# Patient Record
Sex: Female | Born: 1950 | Race: White | Hispanic: No | Marital: Married | Smoking: Never smoker
Health system: Southern US, Community
[De-identification: ages and names within clinical notes are randomized; demographics above are authoritative.]

## PROBLEM LIST (undated history)

## (undated) DIAGNOSIS — J90 Pleural effusion, not elsewhere classified: Secondary | ICD-10-CM

## (undated) DIAGNOSIS — J449 Chronic obstructive pulmonary disease, unspecified: Secondary | ICD-10-CM

## (undated) DIAGNOSIS — M069 Rheumatoid arthritis, unspecified: Secondary | ICD-10-CM

## (undated) DIAGNOSIS — I2510 Atherosclerotic heart disease of native coronary artery without angina pectoris: Secondary | ICD-10-CM

## (undated) DIAGNOSIS — D649 Anemia, unspecified: Secondary | ICD-10-CM

## (undated) DIAGNOSIS — I10 Essential (primary) hypertension: Secondary | ICD-10-CM

## (undated) DIAGNOSIS — J069 Acute upper respiratory infection, unspecified: Secondary | ICD-10-CM

## (undated) DIAGNOSIS — I82409 Acute embolism and thrombosis of unspecified deep veins of unspecified lower extremity: Secondary | ICD-10-CM

## (undated) DIAGNOSIS — C44101 Unspecified malignant neoplasm of skin of unspecified eyelid, including canthus: Secondary | ICD-10-CM

## (undated) DIAGNOSIS — T8859XA Other complications of anesthesia, initial encounter: Secondary | ICD-10-CM

## (undated) DIAGNOSIS — E785 Hyperlipidemia, unspecified: Secondary | ICD-10-CM

## (undated) DIAGNOSIS — I214 Non-ST elevation (NSTEMI) myocardial infarction: Secondary | ICD-10-CM

## (undated) DIAGNOSIS — R918 Other nonspecific abnormal finding of lung field: Secondary | ICD-10-CM

## (undated) DIAGNOSIS — T4145XA Adverse effect of unspecified anesthetic, initial encounter: Secondary | ICD-10-CM

## (undated) DIAGNOSIS — Z8739 Personal history of other diseases of the musculoskeletal system and connective tissue: Secondary | ICD-10-CM

## (undated) HISTORY — PX: CORONARY ANGIOPLASTY: SHX604

## (undated) HISTORY — DX: Atherosclerotic heart disease of native coronary artery without angina pectoris: I25.10

## (undated) HISTORY — PX: EYE SURGERY: SHX253

## (undated) HISTORY — PX: KNEE ARTHROSCOPY: SHX127

## (undated) HISTORY — DX: Chronic obstructive pulmonary disease, unspecified: J44.9

## (undated) HISTORY — DX: Rheumatoid arthritis, unspecified: M06.9

## (undated) HISTORY — DX: Other nonspecific abnormal finding of lung field: R91.8

## (undated) HISTORY — DX: Hyperlipidemia, unspecified: E78.5

## (undated) HISTORY — PX: APPENDECTOMY: SHX54

## (undated) HISTORY — PX: JOINT REPLACEMENT: SHX530

## (undated) HISTORY — DX: Pleural effusion, not elsewhere classified: J90

---

## 1987-01-26 DIAGNOSIS — D649 Anemia, unspecified: Secondary | ICD-10-CM

## 1987-01-26 HISTORY — DX: Anemia, unspecified: D64.9

## 1987-01-26 HISTORY — PX: ABDOMINAL HYSTERECTOMY: SHX81

## 1994-01-25 HISTORY — PX: STAPEDECTOMY: SHX2435

## 1997-07-04 ENCOUNTER — Observation Stay (HOSPITAL_COMMUNITY): Admission: EM | Admit: 1997-07-04 | Discharge: 1997-07-05 | Payer: Self-pay | Admitting: Emergency Medicine

## 1998-02-17 ENCOUNTER — Ambulatory Visit (HOSPITAL_COMMUNITY): Admission: RE | Admit: 1998-02-17 | Discharge: 1998-02-17 | Payer: Self-pay | Admitting: Obstetrics and Gynecology

## 1998-12-08 ENCOUNTER — Other Ambulatory Visit: Admission: RE | Admit: 1998-12-08 | Discharge: 1998-12-08 | Payer: Self-pay | Admitting: Obstetrics and Gynecology

## 1999-05-26 HISTORY — PX: COLONOSCOPY: SHX174

## 1999-06-25 ENCOUNTER — Ambulatory Visit (HOSPITAL_COMMUNITY): Admission: RE | Admit: 1999-06-25 | Discharge: 1999-06-25 | Payer: Self-pay | Admitting: *Deleted

## 1999-08-05 ENCOUNTER — Encounter: Payer: Self-pay | Admitting: Obstetrics and Gynecology

## 1999-08-05 ENCOUNTER — Encounter: Admission: RE | Admit: 1999-08-05 | Discharge: 1999-08-05 | Payer: Self-pay | Admitting: Obstetrics and Gynecology

## 2001-11-02 ENCOUNTER — Inpatient Hospital Stay (HOSPITAL_COMMUNITY): Admission: EM | Admit: 2001-11-02 | Discharge: 2001-11-10 | Payer: Self-pay | Admitting: Emergency Medicine

## 2001-11-02 ENCOUNTER — Encounter: Payer: Self-pay | Admitting: General Surgery

## 2001-11-06 ENCOUNTER — Encounter: Payer: Self-pay | Admitting: General Surgery

## 2002-02-06 ENCOUNTER — Encounter: Admission: RE | Admit: 2002-02-06 | Discharge: 2002-02-06 | Payer: Self-pay

## 2002-03-01 ENCOUNTER — Encounter: Payer: Self-pay | Admitting: Neurosurgery

## 2002-03-01 ENCOUNTER — Ambulatory Visit (HOSPITAL_COMMUNITY): Admission: AD | Admit: 2002-03-01 | Discharge: 2002-03-01 | Payer: Self-pay | Admitting: Neurosurgery

## 2002-03-19 ENCOUNTER — Encounter: Payer: Self-pay | Admitting: Neurosurgery

## 2002-03-19 ENCOUNTER — Encounter: Admission: RE | Admit: 2002-03-19 | Discharge: 2002-03-19 | Payer: Self-pay | Admitting: Neurosurgery

## 2002-04-05 ENCOUNTER — Encounter: Admission: RE | Admit: 2002-04-05 | Discharge: 2002-04-05 | Payer: Self-pay | Admitting: Neurosurgery

## 2002-04-05 ENCOUNTER — Encounter: Payer: Self-pay | Admitting: Neurosurgery

## 2002-05-16 ENCOUNTER — Encounter: Admission: RE | Admit: 2002-05-16 | Discharge: 2002-08-14 | Payer: Self-pay | Admitting: Neurosurgery

## 2002-07-20 ENCOUNTER — Encounter: Payer: Self-pay | Admitting: Obstetrics and Gynecology

## 2002-07-20 ENCOUNTER — Encounter: Admission: RE | Admit: 2002-07-20 | Discharge: 2002-07-20 | Payer: Self-pay | Admitting: Obstetrics and Gynecology

## 2002-10-11 ENCOUNTER — Encounter: Admission: RE | Admit: 2002-10-11 | Discharge: 2003-01-09 | Payer: Self-pay | Admitting: Neurosurgery

## 2003-05-09 ENCOUNTER — Encounter: Admission: RE | Admit: 2003-05-09 | Discharge: 2003-05-09 | Payer: Self-pay | Admitting: Family Medicine

## 2003-10-15 ENCOUNTER — Encounter: Admission: RE | Admit: 2003-10-15 | Discharge: 2003-10-15 | Payer: Self-pay | Admitting: Obstetrics and Gynecology

## 2004-01-26 DIAGNOSIS — C44101 Unspecified malignant neoplasm of skin of unspecified eyelid, including canthus: Secondary | ICD-10-CM

## 2004-01-26 HISTORY — DX: Unspecified malignant neoplasm of skin of unspecified eyelid, including canthus: C44.101

## 2005-06-30 ENCOUNTER — Encounter: Admission: RE | Admit: 2005-06-30 | Discharge: 2005-06-30 | Payer: Self-pay | Admitting: Obstetrics and Gynecology

## 2006-07-27 ENCOUNTER — Encounter: Admission: RE | Admit: 2006-07-27 | Discharge: 2006-07-27 | Payer: Self-pay | Admitting: Obstetrics and Gynecology

## 2008-05-16 ENCOUNTER — Encounter: Admission: RE | Admit: 2008-05-16 | Discharge: 2008-05-16 | Payer: Self-pay | Admitting: Obstetrics and Gynecology

## 2009-04-07 ENCOUNTER — Encounter: Admission: RE | Admit: 2009-04-07 | Discharge: 2009-04-07 | Payer: Self-pay | Admitting: Family Medicine

## 2009-09-25 DIAGNOSIS — J90 Pleural effusion, not elsewhere classified: Secondary | ICD-10-CM

## 2009-09-25 DIAGNOSIS — I214 Non-ST elevation (NSTEMI) myocardial infarction: Secondary | ICD-10-CM

## 2009-09-25 HISTORY — PX: CORONARY ARTERY BYPASS GRAFT: SHX141

## 2009-09-25 HISTORY — DX: Pleural effusion, not elsewhere classified: J90

## 2009-09-25 HISTORY — DX: Non-ST elevation (NSTEMI) myocardial infarction: I21.4

## 2009-09-25 HISTORY — PX: CARDIAC CATHETERIZATION: SHX172

## 2009-09-26 ENCOUNTER — Encounter: Payer: Self-pay | Admitting: Cardiology

## 2009-09-27 ENCOUNTER — Encounter: Payer: Self-pay | Admitting: Cardiology

## 2009-09-28 ENCOUNTER — Encounter: Payer: Self-pay | Admitting: Cardiology

## 2009-10-01 ENCOUNTER — Encounter: Payer: Self-pay | Admitting: Cardiology

## 2009-10-08 ENCOUNTER — Encounter: Payer: Self-pay | Admitting: Cardiology

## 2009-11-03 ENCOUNTER — Ambulatory Visit: Payer: Self-pay | Admitting: Cardiology

## 2009-11-03 DIAGNOSIS — I4891 Unspecified atrial fibrillation: Secondary | ICD-10-CM | POA: Insufficient documentation

## 2009-11-03 DIAGNOSIS — Z951 Presence of aortocoronary bypass graft: Secondary | ICD-10-CM

## 2009-11-03 DIAGNOSIS — E7800 Pure hypercholesterolemia, unspecified: Secondary | ICD-10-CM | POA: Insufficient documentation

## 2009-11-03 DIAGNOSIS — I2510 Atherosclerotic heart disease of native coronary artery without angina pectoris: Secondary | ICD-10-CM

## 2009-11-06 ENCOUNTER — Telehealth: Payer: Self-pay | Admitting: Cardiology

## 2009-11-07 ENCOUNTER — Telehealth: Payer: Self-pay | Admitting: Cardiology

## 2009-11-10 ENCOUNTER — Inpatient Hospital Stay (HOSPITAL_COMMUNITY): Admission: EM | Admit: 2009-11-10 | Discharge: 2009-11-12 | Payer: Self-pay | Admitting: Emergency Medicine

## 2009-11-10 ENCOUNTER — Ambulatory Visit: Payer: Self-pay | Admitting: Internal Medicine

## 2009-11-10 ENCOUNTER — Encounter: Payer: Self-pay | Admitting: Cardiology

## 2009-11-10 ENCOUNTER — Telehealth: Payer: Self-pay | Admitting: Cardiology

## 2009-11-11 ENCOUNTER — Encounter: Payer: Self-pay | Admitting: Cardiology

## 2009-11-20 ENCOUNTER — Encounter (HOSPITAL_COMMUNITY)
Admission: RE | Admit: 2009-11-20 | Discharge: 2010-02-18 | Payer: Self-pay | Source: Home / Self Care | Attending: Cardiology | Admitting: Cardiology

## 2009-11-21 ENCOUNTER — Telehealth: Payer: Self-pay | Admitting: Cardiology

## 2009-11-21 ENCOUNTER — Ambulatory Visit: Payer: Self-pay | Admitting: Pulmonary Disease

## 2009-11-21 ENCOUNTER — Encounter: Payer: Self-pay | Admitting: Pulmonary Disease

## 2009-11-21 DIAGNOSIS — R071 Chest pain on breathing: Secondary | ICD-10-CM

## 2009-11-21 DIAGNOSIS — J90 Pleural effusion, not elsewhere classified: Secondary | ICD-10-CM | POA: Insufficient documentation

## 2009-12-10 ENCOUNTER — Inpatient Hospital Stay (HOSPITAL_COMMUNITY)
Admission: EM | Admit: 2009-12-10 | Discharge: 2009-12-12 | Payer: Self-pay | Source: Home / Self Care | Admitting: Emergency Medicine

## 2009-12-10 ENCOUNTER — Ambulatory Visit: Payer: Self-pay | Admitting: Cardiovascular Disease

## 2009-12-10 ENCOUNTER — Ambulatory Visit: Payer: Self-pay | Admitting: Family Medicine

## 2009-12-11 ENCOUNTER — Encounter: Payer: Self-pay | Admitting: Pulmonary Disease

## 2009-12-12 ENCOUNTER — Encounter: Payer: Self-pay | Admitting: Family Medicine

## 2009-12-12 ENCOUNTER — Encounter: Payer: Self-pay | Admitting: Pulmonary Disease

## 2009-12-12 ENCOUNTER — Ambulatory Visit: Payer: Self-pay | Admitting: Cardiology

## 2009-12-16 ENCOUNTER — Telehealth: Payer: Self-pay | Admitting: Cardiology

## 2009-12-16 ENCOUNTER — Encounter: Payer: Self-pay | Admitting: Cardiology

## 2009-12-17 ENCOUNTER — Ambulatory Visit: Payer: Self-pay | Admitting: Pulmonary Disease

## 2009-12-17 ENCOUNTER — Encounter: Payer: Self-pay | Admitting: Cardiology

## 2009-12-17 DIAGNOSIS — M069 Rheumatoid arthritis, unspecified: Secondary | ICD-10-CM | POA: Insufficient documentation

## 2009-12-23 ENCOUNTER — Ambulatory Visit: Payer: Self-pay | Admitting: Cardiology

## 2009-12-23 ENCOUNTER — Telehealth: Payer: Self-pay | Admitting: Cardiology

## 2009-12-23 DIAGNOSIS — Z8669 Personal history of other diseases of the nervous system and sense organs: Secondary | ICD-10-CM

## 2009-12-23 LAB — CONVERTED CEMR LAB
BUN: 18 mg/dL (ref 6–23)
Chloride: 103 meq/L (ref 96–112)
Creatinine, Ser: 1.1 mg/dL (ref 0.4–1.2)
Glucose, Bld: 120 mg/dL — ABNORMAL HIGH (ref 70–99)

## 2009-12-24 ENCOUNTER — Encounter: Payer: Self-pay | Admitting: Cardiology

## 2010-01-05 ENCOUNTER — Encounter: Payer: Self-pay | Admitting: Cardiology

## 2010-01-07 ENCOUNTER — Encounter: Payer: Self-pay | Admitting: Cardiology

## 2010-02-15 ENCOUNTER — Encounter: Payer: Self-pay | Admitting: Family Medicine

## 2010-02-17 ENCOUNTER — Ambulatory Visit
Admission: RE | Admit: 2010-02-17 | Discharge: 2010-02-17 | Payer: Self-pay | Source: Home / Self Care | Attending: Cardiology | Admitting: Cardiology

## 2010-02-18 ENCOUNTER — Ambulatory Visit
Admission: RE | Admit: 2010-02-18 | Discharge: 2010-02-18 | Payer: Self-pay | Source: Home / Self Care | Attending: Cardiology | Admitting: Cardiology

## 2010-02-18 ENCOUNTER — Other Ambulatory Visit: Payer: Self-pay | Admitting: Cardiology

## 2010-02-18 LAB — LIPID PANEL
Cholesterol: 196 mg/dL (ref 0–200)
LDL Cholesterol: 126 mg/dL — ABNORMAL HIGH (ref 0–99)
Triglycerides: 101 mg/dL (ref 0.0–149.0)
VLDL: 20.2 mg/dL (ref 0.0–40.0)

## 2010-02-18 LAB — HEPATIC FUNCTION PANEL
ALT: 12 U/L (ref 0–35)
Albumin: 3.3 g/dL — ABNORMAL LOW (ref 3.5–5.2)
Total Bilirubin: 0.7 mg/dL (ref 0.3–1.2)

## 2010-02-19 ENCOUNTER — Encounter (HOSPITAL_COMMUNITY)
Admission: RE | Admit: 2010-02-19 | Discharge: 2010-02-24 | Payer: Self-pay | Source: Home / Self Care | Attending: Cardiology | Admitting: Cardiology

## 2010-02-24 NOTE — Miscellaneous (Signed)
Summary: RX for Furosemide and KCL  Clinical Lists Changes  Medications: Added new medication of FUROSEMIDE 20 MG TABS (FUROSEMIDE) one daily or as directed - Signed Added new medication of POTASSIUM CHLORIDE CRYS CR 10 MEQ CR-TABS (POTASSIUM CHLORIDE CRYS CR) one daily with Furosemide or as directed - Signed Rx of FUROSEMIDE 20 MG TABS (FUROSEMIDE) one daily or as directed;  #30 x 1;  Signed;  Entered by: Charolotte Capuchin, RN;  Authorized by: Rollene Rotunda, MD, [facility];  Method used: Faxed to [facility], [address], [place], [place], [place]  [address], Ph: [phone] or [phone], Fax: [phone] Rx of POTASSIUM CHLORIDE CRYS CR 10 MEQ CR-TABS (POTASSIUM CHLORIDE CRYS CR) one daily with Furosemide or as directed;  #30 x 1;  Signed;  Entered by: Charolotte Capuchin, RN;  Authorized by: Rollene Rotunda, MD, [facility];  Method used: Faxed to [facility], [address], [place], [place], [place]  [address], Ph: [phone] or [phone], Fax: [phone]    Prescriptions: POTASSIUM CHLORIDE CRYS CR 10 MEQ CR-TABS (POTASSIUM CHLORIDE CRYS CR) one daily with Furosemide or as directed  #30 x 1   Entered by:   Charolotte Capuchin, RN   Authorized by:   Rollene Rotunda, MD, [facility]   Signed by:   Charolotte Capuchin, RN on 11/04/2009   Method used:   Faxed to ...       [facility] (retail)       [address]       [place], [place]  [address]       Ph: [phone] or [phone]       Fax: [phone]   RxID:   [id number] FUROSEMIDE 20 MG TABS (FUROSEMIDE) one daily or as directed  #30 x 1   Entered by:   Charolotte Capuchin, RN   Authorized by:   Rollene Rotunda, MD, [facility]   Signed by:   Charolotte Capuchin, RN on 11/04/2009   Method used:   Faxed to ...       [facility] (retail)       [address]       [place], [place]  [address]       Ph: [phone] or  [phone]       Fax: [phone]   RxID:   [id number]

## 2010-02-24 NOTE — Assessment & Plan Note (Signed)
Summary: hfu/mhh   Visit Type:  Initial Consult Copy to:  [facility] Primary Provider/Referring Provider:  Dr. Nathanial Rancher  CC:  c/o left chest pain radiating to left side, lower back, and and left shoulder worse when laying down .  History of Present Illness: 59/F, never smoker with RA , for post hospital FU of pleural effusions post CABG  s/p emergent CABG x 3 in sep '11 @ [facility]  10/17-19/11 with rt chest pain & dyspnea, CT chest cardiomegaly and bilateral pleural effusions/bibasilar atelectasis.  Bilateral pulmonary nodules.  Mild right hilar adenopathy. Likely reactive related to congestive heart failure.    Chest fluoroscopy:  There is some limitation of range of motion of the left hemidiaphragm, there is no paradoxical motion to suggest paralysis.  2D echocardiogram :  LVEF 60- 65%.  Features consistent with grade 2 diastolic dysfunction.  Ventricular septum motion showed abnormal function and dissymmetry, which is consistent with a post thoracotomy state. PFTs >> Sever chronic obstructive pulmonary disease per PFTs. Moderately severe restrictive - parenchymal on PFTs (likely secondary to obesity and bilateral pleural effusions as well as impaired left diaphragm excursion). Given 20 mg pred x 5 ds, ON advair  & lasix , Just started rehab  November 21, 2009  Developed sharp lt chest pain x 1 day , radiating to left shoulder, pleuritic, non exertional Off methotrexate since CABG (Dr Dareen Piano)  EKG nml, mild T wave changes CXR - improved effusions, esp on rt, lt persists but better  Preventive Screening-Counseling & Management  Alcohol-Tobacco     Alcohol drinks/day: 0     Smoking Status: never  Current Medications (verified): 1)  Fish Oil 1000 Mg Caps (Omega-3 Fatty Acids) .... Up To 3 Tabs Daily 2)  Glucosamine-Chondroitin  Caps (Glucosamine-Chondroit-Vit C-Mn) .Marland Kitchen.. 1 By Mouth Daily 3)  Eleviv .Marland Kitchen.. 1 By Mouth Daily 4)  Aspirin 81 Mg  Tabs (Aspirin) .Marland Kitchen.. 1 By Mouth  Daily 5)  Folic Acid 800 Mcg Tabs (Folic Acid) .Marland Kitchen.. 1 By Mouth Daily 6)  Vitamin D3 2000 Unit Caps (Cholecalciferol) .Marland Kitchen.. 1 By Mouth Daily 7)  Probiotic  Caps (Probiotic Product) .Marland Kitchen.. 1 By Mouth Daily 8)  Metoprolol Tartrate 25 Mg Tabs (Metoprolol Tartrate) .... 1/2 By Mouth Two Times A Day 9)  Simvastatin 10 Mg Tabs (Simvastatin) .... Take 1/2 Tablet By Mouth Once A Day 10)  Oxycodone Hcl 5 Mg Tabs (Oxycodone Hcl) .... As Needed 11)  Optic-Vites  Tabs (Multiple Vitamins-Minerals) .... Opti-Vue--- Take 1 Tablet By Mouth Once A Day 12)  Stool Softener 100 Mg Caps (Docusate Sodium) .Marland Kitchen.. 1 By Mouth Daily 13)  Zyflamend .Marland Kitchen.. 1 By Mouth Daily 14)  Multitrace-4 [phone] Mcg/ml Soln (Trace Minerals Cr-Cu-Mn-Zn) .... One Drop Once Daily 15)  Calcium Magnesium 500-100-500 Liqd (Calcium-Magnesium-Vitamin D) .Marland Kitchen.. 1 By Mouth Daily 16)  Xango .... As Directed 17)  Furosemide 20 Mg Tabs (Furosemide) .... One Daily or As Directed 18)  Potassium Chloride Crys Cr 10 Meq Cr-Tabs (Potassium Chloride Crys Cr) .... One Daily With Furosemide or As Directed  Allergies (verified): 1)  ! * Aleve  Past History:  Past Medical History: Last updated: 11/03/2009 Hyperlipidemia (new) CAD  RA  Past Surgical History: Last updated: 11/03/2009 CABG (LIMA to the LAD, SVG right coronary artery, SVG to obtuse marginal September 2011 [employer]) C-Section x2 Arthroscopic knee surgery Stapedectomy C-Sections x2  Social History: Smoking Status:  never Alcohol drinks/day:  0  Review of Systems       The  patient complains of shortness of breath with activity, non-productive cough, chest pain, irregular heartbeats, loss of appetite, weight change, sore throat, headaches, nasal congestion/difficulty breathing through nose, sneezing, itching, and anxiety.  The patient denies shortness of breath at rest, productive cough, coughing up blood, acid heartburn, indigestion, abdominal pain, difficulty  swallowing, tooth/dental problems, ear ache, depression, hand/feet swelling, joint stiffness or pain, rash, change in color of mucus, and fever.    Vital Signs:  Patient profile:   60 year old female Height:      72 inches Weight:      207.2 pounds BMI:     28.20 O2 Sat:      94 % on Room air Temp:     98.6 degrees F oral Pulse rate:   78 / minute BP sitting:   144 / 84  (left arm) Cuff size:   regular  Vitals Entered By: Zackery Barefoot CMA (November 21, 2009 10:32 AM)  O2 Flow:  Room air CC: c/o left chest pain radiating to left side, lower back, and left shoulder worse when laying down  Pain Assessment Patient in pain? yes     Location: chest Intensity: 4 Type: sharp Onset of pain  Intermittent Comments Medications reviewed with patient Verified contact number and pharmacy with patient Zackery Barefoot CMA  November 21, 2009 10:33 AM    Physical Exam  Additional Exam:  Gen. Pleasant, well-nourished, in no distress, normal affect ENT - no lesions, no post nasal drip Neck: No JVD, no thyromegaly, no carotid bruits Lungs: no use of accessory muscles, no dullness to percussion, lt basal crackles, nor rhonchi  Cardiovascular: Rhythm regular, heart sounds  normal, no murmurs or gallops, no peripheral edema Abdomen: soft and non-tender, no hepatosplenomegaly, BS normal. Musculoskeletal: No deformities, no cyanosis or clubbing Neuro:  alert, non focal     Impression & Recommendations:  Problem # 1:  PLEURAL EFFUSION, LEFT (ICD-511.9) post CABg - will treat as pleurisy Low dose prednisone x 2 weeks then use NSAIDs , rpt CXR x 3 weeks ct lasix Orders: T-2 View CXR (71020TC) Est. Patient Level V (78469) Prescription Created Electronically [phone])  Problem # 2:  HYPERCHOLESTEROLEMIA (ICD-272.0) Off amio now, defer dosing of zocor to cards Her updated medication list for this problem includes:    Simvastatin 10 Mg Tabs (Simvastatin) .Marland Kitchen... Take 1/2 tablet by mouth once  a day  Problem # 3:  CHEST PAIN-PAINFUL RESPIRATION (WUX-324.40)  Not typical for angina CT angio neg for pE Favor pleuritic post CABG symptomatic treatment - Her updated medication list for this problem includes:    Aspirin 81 Mg Tabs (Aspirin) .Marland Kitchen... 1 by mouth daily  Orders: Est. Patient Level V (10272) Prescription Created Electronically [phone])  Medications Added to Medication List This Visit: 1)  Fish Oil 1000 Mg Caps (Omega-3 fatty acids) .... Up to 3 tabs daily 2)  Eleviv  .Marland KitchenMarland Kitchen. 1 by mouth daily 3)  Simvastatin 10 Mg Tabs (Simvastatin) .... Take 1/2 tablet by mouth once a day 4)  Oxycodone Hcl 5 Mg Tabs (Oxycodone hcl) .... As needed 5)  Optic-vites Tabs (Multiple vitamins-minerals) .... Opti-vue--- take 1 tablet by mouth once a day 6)  Multitrace-4 [phone] Mcg/ml Soln (Trace minerals cr-cu-mn-zn) .... One drop once daily 7)  Prednisone 5 Mg Tabs (Prednisone) .... Take 2 tabs once daily x 1 week then 1 tab once daily x 1 week  Patient Instructions: 1)  Copy sent to: dr hochrein 2)  Prednisone x 2 weeks as directed  3)  Then take ibuprofen two times a day with food 4)  Take tylenol or oxycodone as needed for pain 5)  Your chest xray looks better 6)  Please schedule a follow-up appointment in 3 weeks with chest xray Prescriptions: PREDNISONE 5 MG TABS (PREDNISONE) take 2 tabs once daily x 1 week then 1 tab once daily x 1 week  #21 x 0   Entered and Authorized by:   Comer Locket Vassie Loll MD   Signed by:   Comer Locket Vassie Loll MD on 11/21/2009   Method used:   Print then Give to Patient   RxID:   [id number]     Immunization History:  Influenza Immunization History:    Influenza:  historical (10/08/2009)  Pneumovax Immunization History:    Pneumovax:  historical (11/10/2009)

## 2010-02-24 NOTE — Progress Notes (Signed)
Summary: SOB/CHEST PAIN  Phone Note Call from Patient   Caller: Patient Reason for Call: Talk to Nurse Summary of Call: PT IS SHORT OF BREATH PT STATES CHEST PAIN WHEN SHE   Follow-up for Phone Call        Pt reports she is continuing to have shortness of breath and is having little urine output. Pt states she has also been having pain on right side and to right shoulder. Pain is worse when taking deep breath. Pt sounds very SOB while talking to me and is coughing frequently. I instructed pt she should call 911 and have ambulance transport her to ED at [facility].  She does not want to call EMS and will call friend to transport her.   Follow-up by: Dossie Arbour, RN, BSN,  November 10, 2009 12:29 PM

## 2010-02-24 NOTE — Letter (Signed)
Summary: [facility] Office Note   [facility] Office Note   Imported By: Roderic Ovens 12/10/2009 15:50:57  _____________________________________________________________________  External Attachment:    Type:   Image     Comment:   External Document

## 2010-02-24 NOTE — Assessment & Plan Note (Signed)
Summary: 3 week return/mhh   Copy to:  [facility] Primary Provider/Referring Provider:  Dr. Nathanial Rancher   History of Present Illness: 59/F, never smoker with RA , for post hospital FU of pleural effusions post CABG  s/p emergent CABG x 3 in sep '11 @ [facility]  10/17-19/11 with rt chest pain & dyspnea, CT chest cardiomegaly and bilateral pleural effusions/bibasilar atelectasis.  Bilateral pulmonary nodules.  Mild right hilar adenopathy. Likely reactive related to congestive heart failure.    Chest fluoroscopy:  Limited motion of the left hemidiaphragm, no paradoxical motion to suggest paralysis.  2D echocardiogram :  LVEF 60- 65%.  Features consistent with grade 2 diastolic dysfunction.  Ventricular septum motion showed abnormal function and dissymmetry, consistent with a post thoracotomy state. PFTs >> Severe airway obstructionper PFTs. Moderately severe restrictive - parenchymal on PFTs (likely secondary to obesity and bilateral pleural effusions as well as impaired left diaphragm excursion). Given 20 mg pred x 5 ds, ON advair  & lasix , Just started rehab  November 21, 2009  Developed sharp lt chest pain x 1 day , radiating to left shoulder, pleuritic, non exertional Off methotrexate since CABG (Dr Dareen Piano)  EKG nml, mild T wave changes, CXR - improved effusions, esp on rt, lt persists but better  December 17, 2009 11:24 AM  Hosp adm - dizzy & faint, hot & flushed, woke up on the lfoor - echo nml, CT angio neg PE , decreased pericardial effusion, rt pleural effusion - resolved, lt persists small with layering on decub film -IMPROVED from last month RA flared up the week before, Dr Dareen Piano kept her on 10 mg pred & restarted mTx -  Amio has been stopped. Tearful, needs Rx for prednisone & hydrocodone  Preventive Screening-Counseling & Management  Alcohol-Tobacco     Alcohol drinks/day: 0     Smoking Status: never  Current Medications (verified): 1)  Fish Oil 1000 Mg Caps  (Omega-3 Fatty Acids) .... Up To 3 Tabs Daily 2)  Glucosamine-Chondroitin  Caps (Glucosamine-Chondroit-Vit C-Mn) .Marland Kitchen.. 1 By Mouth Daily 3)  Eleviv .Marland Kitchen.. 1 By Mouth Daily 4)  Aspirin 81 Mg  Tabs (Aspirin) .Marland Kitchen.. 1 By Mouth Daily 5)  Folic Acid 800 Mcg Tabs (Folic Acid) .Marland Kitchen.. 1 By Mouth Daily 6)  Vitamin D3 2000 Unit Caps (Cholecalciferol) .Marland Kitchen.. 1 By Mouth Daily 7)  Probiotic  Caps (Probiotic Product) .Marland Kitchen.. 1 By Mouth Daily 8)  Metoprolol Tartrate 25 Mg Tabs (Metoprolol Tartrate) .... 1/2 By Mouth Two Times A Day 9)  Simvastatin 20 Mg Tabs (Simvastatin) .... Take 1 Tablet By Mouth Once A Day 10)  Oxycodone Hcl 5 Mg Tabs (Oxycodone Hcl) .... As Needed 11)  Optic-Vites  Tabs (Multiple Vitamins-Minerals) .... Opti-Vue--- Take 1 Tablet By Mouth Once A Day 12)  Stool Softener 100 Mg Caps (Docusate Sodium) .Marland Kitchen.. 1 By Mouth Daily 13)  Multitrace-4 [phone] Mcg/ml Soln (Trace Minerals Cr-Cu-Mn-Zn) .... 30 Drops Daily 14)  Calcium Magnesium 500-100-500 Liqd (Calcium-Magnesium-Vitamin D) .Marland Kitchen.. 1 By Mouth Daily 15)  Xango .... As Directed 16)  Furosemide 20 Mg Tabs (Furosemide) .... One Daily or As Directed 17)  Potassium Chloride Crys Cr 10 Meq Cr-Tabs (Potassium Chloride Crys Cr) .... One Daily With Furosemide or As Directed 18)  Advair Diskus 250-50 Mcg/dose Aepb (Fluticasone-Salmeterol) .... Inhale 1 Puff Two Times A Day 19)  Prednisone 1 Mg Tabs (Prednisone) .... 10mg  Once Daily 20)  Nitrostat 0.4 Mg Subl (Nitroglycerin) .... As Needed  Allergies (verified): 1)  ! * Aleve  Past History:  Past Medical History: Last updated: 11/03/2009 Hyperlipidemia (new) CAD  RA  Social History: Last updated: 11/03/2009 The patient has never smoked cigarettes.  She does not drink alcohol.  She is married with 2 children.  Review of Systems  The patient denies anorexia, fever, weight loss, weight gain, vision loss, decreased hearing, hoarseness, chest pain, syncope, dyspnea on exertion, peripheral edema,  prolonged cough, headaches, hemoptysis, abdominal pain, melena, hematochezia, severe indigestion/heartburn, hematuria, muscle weakness, suspicious skin lesions, difficulty walking, depression, unusual weight change, abnormal bleeding, enlarged lymph nodes, and angioedema.    Vital Signs:  Patient profile:   60 year old female Height:      72 inches Weight:      207 pounds BMI:     28.18 O2 Sat:      97 % on Room air Temp:     98.3 degrees F oral Pulse rate:   65 / minute BP sitting:   130 / 72  (right arm) Cuff size:   large  Vitals Entered By: Zackery Barefoot CMA (December 17, 2009 11:01 AM)  O2 Flow:  Room air Comments Medications reviewed with patient Verified contact number and pharmacy with patient Zackery Barefoot CMA  December 17, 2009 11:02 AM    Physical Exam  Additional Exam:  Gen. Pleasant, well-nourished, in no distress, normal affect ENT - no lesions, no post nasal drip Neck: No JVD, no thyromegaly, no carotid bruits Lungs: no use of accessory muscles, no dullness to percussion, lt basal crackles, no rhonchi  Cardiovascular: Rhythm regular, heart sounds  normal, no murmurs or gallops, no peripheral edema Musculoskeletal: No deformities, no cyanosis or clubbing      Impression & Recommendations:  Problem # 1:  PLEURAL EFFUSION, LEFT (ICD-511.9) Assessment Improved  post CABg - does not need thoracentesis - gradual but definite improvement ct lasix, FU CXR in 3 mnths steroids being used for RA flare - might help too. Unclear if nodules represent rheumatoid lung - will need FU CT in 6 mnths  Orders: Est. Patient Level IV (04540)  Problem # 2:  CHEST PAIN-PAINFUL RESPIRATION (ICD-786.52) resolved Her updated medication list for this problem includes:    Aspirin 81 Mg Tabs (Aspirin) .Marland Kitchen... 1 by mouth daily    Nitrostat 0.4 Mg Subl (Nitroglycerin) .Marland Kitchen... As needed  Problem # 3:  RHEUMATOID ARTHRITIS (ICD-714.0)  refilled hydrocodone until she sees dr  Dareen Piano  Orders: Est. Patient Level IV (98119)  Medications Added to Medication List This Visit: 1)  Simvastatin 20 Mg Tabs (Simvastatin) .... Take 1 tablet by mouth once a day 2)  Oxycodone Hcl 5 Mg Tabs (Oxycodone hcl) .... Two times a day as needed 3)  Multitrace-4 [phone] Mcg/ml Soln (Trace minerals cr-cu-mn-zn) .... 30 drops daily 4)  Advair Diskus 250-50 Mcg/dose Aepb (Fluticasone-salmeterol) .... Inhale 1 puff two times a day 5)  Advair Diskus 250-50 Mcg/dose Aepb (Fluticasone-salmeterol) .... Inhale 1 puff two times a day 6)  Prednisone 10 Mg Tabs (Prednisone) .... Once daily with food 7)  Prednisone 1 Mg Tabs (Prednisone) .... 10mg  once daily 8)  Nitrostat 0.4 Mg Subl (Nitroglycerin) .... As needed  Patient Instructions: 1)  Copy sent to: Dr Dareen Piano, dr hochrein 2)  Please schedule a follow-up appointment in 3 months with chest x ray 3)  Prednisone R x sent  in 4)  Your effusions (fluid ) are better 5)  Stay on the advair two times a day  Prescriptions: OXYCODONE HCL 5 MG TABS (OXYCODONE HCL) two  times a day as needed  #20 x 0   Entered and Authorized by:   Comer Locket. Vassie Loll MD   Signed by:   Comer Locket Vassie Loll MD on 12/17/2009   Method used:   Print then Give to Patient   RxID:   [id number] ADVAIR DISKUS 250-50 MCG/DOSE AEPB (FLUTICASONE-SALMETEROL) Inhale 1 puff two times a day  #1 x 3   Entered and Authorized by:   Comer Locket Vassie Loll MD   Signed by:   Comer Locket Vassie Loll MD on 12/17/2009   Method used:   Print then Give to Patient   RxID:   [id number] PREDNISONE 10 MG TABS (PREDNISONE) once daily with food  #30 x 0   Entered and Authorized by:   Comer Locket. Vassie Loll MD   Signed by:   Comer Locket Vassie Loll MD on 12/17/2009   Method used:   Print then Give to Patient   RxID:   [id number]

## 2010-02-24 NOTE — Letter (Signed)
Summary: [facility]  [facility]   Imported By: Marylou Mccoy 12/26/2009 17:21:17  _____________________________________________________________________  External Attachment:    Type:   Image     Comment:   External Document

## 2010-02-24 NOTE — Assessment & Plan Note (Signed)
Summary: np6/sob./ non-st elevation mi and cabg x 3  / gd   Visit Type:  Initial Consult Primary Provider:  Dr. Nathanial Rancher  CC:  CAD.  History of Present Illness: The patient presents as a new patient to establish of her coronary disease status post bypass.  She was on vacation in early September in [place]. She developed some epigastric discomfort followed by left arm pain. She presented to the emergency room where she was found to have a non-Q-wave myocardial infarction. She was subsequently found to have 50% left main stenosis, 90% LAD stenosis, 70% circumflex stenosis and an occluded right coronary artery with collaterals. Her EF was 55%. She underwent CABG with LIMA to the LAD and saphenous vein graft to the right coronary artery and obtuse marginal. She is now back in this area and is establishing with a cardiologist. She has had some problems with mild dyspnea and a slight cough but no PND or orthopnea. She's had no fevers or chills. She's had some sternal wound discomfort and numbness over her anterior chest. She's had mild discomfort in the left leg at the saphenous vein graft harvest site. She's been somewhat anxious. She has been limiting her activities. Of note she had postoperative atrial fibrillation but since going home she has not felt any tachycardia palpitations. She says when she lies back at night she can feel her heart beating she thinks strongly but not irregularly. She has not other symptoms that prompted her hospitalization.  Current Medications (verified): 1)  Fish Oil   Oil (Fish Oil) .Marland Kitchen.. 1 By Mouth Daily 2)  Glucosamine-Chondroitin  Caps (Glucosamine-Chondroit-Vit C-Mn) .Marland Kitchen.. 1 By Mouth Daily 3)  Elviv .Marland Kitchen.. 1 By Mouth Daily 4)  Aspirin 81 Mg  Tabs (Aspirin) .Marland Kitchen.. 1 By Mouth Daily 5)  Folic Acid 800 Mcg Tabs (Folic Acid) .Marland Kitchen.. 1 By Mouth Daily 6)  Vitamin D3 2000 Unit Caps (Cholecalciferol) .Marland Kitchen.. 1 By Mouth Daily 7)  Probiotic  Caps (Probiotic Product) .Marland Kitchen.. 1 By Mouth  Daily 8)  Amiodarone Hcl 200 Mg Tabs (Amiodarone Hcl) .Marland Kitchen.. 1 By Mouth Two Times A Day 9)  Metoprolol Tartrate 25 Mg Tabs (Metoprolol Tartrate) .... 1/2 By Mouth Two Times A Day 10)  Simvastatin 20 Mg Tabs (Simvastatin) .Marland Kitchen.. 1 By Mouth Daily 11)  Roxicodone 5 Mg Tabs (Oxycodone Hcl) .... As Needed 12)  Opti-Vue .Marland Kitchen.. 1 By Mouth Daily 13)  Stool Softener 100 Mg Caps (Docusate Sodium) .Marland Kitchen.. 1 By Mouth Daily 14)  Zyflamend .Marland Kitchen.. 1 By Mouth Daily 15)  Trace Mineral .... 1 By Mouth Daily 16)  Calcium Magnesium 500-100-500 Liqd (Calcium-Magnesium-Vitamin D) .Marland Kitchen.. 1 By Mouth Daily 17)  Xango .... As Directed  Allergies (verified): 1)  ! * Aleve  Past History:  Past Medical History: Hyperlipidemia (new) CAD  RA  Past Surgical History: CABG (LIMA to the LAD, SVG right coronary artery, SVG to obtuse marginal September 2011 [facility]) C-Section x2 Arthroscopic knee surgery Stapedectomy C-Sections x2  Family History: Her mother had bypass in her 41s. She has an older brother with atrial fibrillation.  Social History: The patient has never smoked cigarettes.  She does not drink alcohol.  She is married with 2 children.  Review of Systems       Positive for headaches, difficulty hearing, reflux. Otherwise as stated in the history of present illness negative for all other systems.  Vital Signs:  Patient profile:   60 year old female Height:      72 inches  Weight:      205 pounds BMI:     27.90 Pulse rate:   63 / minute Resp:     16 per minute BP sitting:   158 / 84  (right arm)  Vitals Entered By: Marrion Coy, CNA (November 03, 2009 2:22 PM)  Physical Exam  General:  Well developed, well nourished, in no acute distress. Head:  normocephalic and atraumatic Eyes:  PERRLA/EOM intact; conjunctiva and lids normal. Mouth:  Teeth, gums and palate normal. Oral mucosa normal. Neck:  Neck supple, no JVD. No masses, thyromegaly or abnormal cervical nodes. Chest Wall:   Well-healed sternotomy scar Lungs:  Mild decreased breath sounds in left base without crackles or wheezing Abdomen:  Bowel sounds positive; abdomen soft and non-tender without masses, organomegaly, or hernias noted. No hepatosplenomegaly. Msk:  Back normal, normal gait. Muscle strength and tone normal. Extremities:  Mild left leg edema with well-healed saphenous vein graft harvest sites Neurologic:  Alert and oriented x 3. Skin:  Intact without lesions or rashes. Cervical Nodes:  no significant adenopathy Axillary Nodes:  no significant adenopathy Inguinal Nodes:  no significant adenopathy Psych:  Slightly emotional but appropriate   Detailed Cardiovascular Exam  Neck    Carotids: Carotids full and equal bilaterally without bruits.      Neck Veins: Normal, no JVD.    Heart    Inspection: no deformities or lifts noted.      Palpation: normal PMI with no thrills palpable.      Auscultation: regular rate and rhythm, S1, S2 without murmurs, rubs, gallops, or clicks.    Vascular    Abdominal Aorta: no palpable masses, pulsations, or audible bruits.      Femoral Pulses: normal femoral pulses bilaterally.      Pedal Pulses: normal pedal pulses bilaterally.      Radial Pulses: normal radial pulses bilaterally.      Peripheral Circulation: no clubbing, cyanosis, or edema noted with normal capillary refill.     EKG  Procedure date:  11/03/2009  Findings:      Sinus rhythm, rate 63,axis within normal limits, intervals within normal limits, low voltage chest leads, no acute ST-T wave changes  Impression & Recommendations:  Problem # 1:  CORONARY ARTERY BYPASS GRAFT, HX OF (ICD-V45.81) The patient had many questions and I spent extra time to review this in detail with the patient and her husband.  She is somewhat SOB.  I will check a chest XRay. I will refer her to cardiac rehab.   I reviewed extensively all of her notes from the outside hospital. (Greater than one half hour with this  appointment.)    Problem # 2:  FIBRILLATION, ATRIAL (ICD-427.31) I will reduce amiodarone to 200 mg daily. I will likely stop this when I see her next.  Problem # 3:  HYPERCHOLESTEROLEMIA (ICD-272.0) I will reduce her simvastatin 10 mg while she is on the amiodarone. Orders: EKG w/ Interpretation (93000) Cardiac Rehabilitation (Cardiac Rehab)  Other Orders: T-2 View CXR (71020TC)  Patient Instructions: 1)  Your physician recommends that you schedule a follow-up appointment in: 1 month with Dr Antoine Poche 2)  Your physician has recommended you make the following change in your medication: Decrease Simvastatin to 10 mg a day and Amiodarone to 200 mg once a day 3)  A chest x-ray takes a picture of the organs and structures inside the chest, including the heart, lungs, and blood vessels. This test can show several things, including, whether the heart is enlarged;  whether fluid is building up in the lungs; and whether pacemaker / defibrillator leads are still in place. 4)  Your physician recommends referral and attendance at a Cardiac Rehab Program. Prescriptions: AMIODARONE HCL 200 MG TABS (AMIODARONE HCL) one daily  #30 x 6   Entered by:   Charolotte Capuchin, RN   Authorized by:   Rollene Rotunda, MD, [facility]   Signed by:   Rollene Rotunda, MD, [facility] on 11/03/2009   Method used:   Historical   RxID:   [id number]

## 2010-02-24 NOTE — Progress Notes (Signed)
Summary: need note to start therapy  Phone Note Call from Patient Call back at Home Phone [phone] Call back at [phone]   Caller: Patient Summary of Call: Pt need a notes stating it's okay for pt to have therapy Initial call taken by: Judie Grieve,  December 23, 2009 4:51 PM  Follow-up for Phone Call        12/23/09-1700pm--spoke with pt who states would like to restart c. rehab, but is currently having alot of problems with rheumatoid arthritis--she has been in contact with [facility] at c. rehab--scott weaver saw her today and got her in to see her rheumatologist dr Dareen Piano, tomorrow 11/30--if all goes well with rheumatologist she would like to start back--please call pt tomorrow p.m. and see if given permission to restart rehab--thanks nt Follow-up by: Ledon Snare, RN,  December 23, 2009 5:15 PM     Appended Document: need note to start therapy Ok ot start rehab.

## 2010-02-24 NOTE — Progress Notes (Signed)
Summary: pt having chest pressure  Phone Note Call from Patient Call back at Home Phone [phone]   Caller: Patient Reason for Call: Talk to Nurse, Talk to Doctor Summary of Call: pt feeling pressure in her chest and lungs. she is concerned and wants to talk to someone. Initial call taken by: Omer Jack,  November 21, 2009 8:06 AM  Follow-up for Phone Call        Marilyn Hernandez calls today with increasing pain upon inspiration.  This worsens at night.  The pain is sharp and on the left upper and lower side to the mid back.  She is on lasix 20mg  now and Prednisone 3mg  daily.  She sounds short of breath on the phone and does feel "excited" or anxious while talking.  She continues to use her incentive spirometry obtaining up to 1500 with discomfort on inspiration.  Upon exhalation, she does have a dry cough.  She says this is not quite as bad as when she was in the hospital last week.  She does not want to go to ED. She would like to be evaluated in the office.  I  have called Dr. Vassie Loll office for patient and she has appt. for 10:15 today. Pt is aware of appt. Mylo Red RN

## 2010-02-24 NOTE — Consult Note (Signed)
Summary: [facility]   Imported By: Roderic Ovens 12/10/2009 15:55:08  _____________________________________________________________________  External Attachment:    Type:   Image     Comment:   External Document

## 2010-02-24 NOTE — Consult Note (Signed)
Summary: [facility]   Imported By: Sherian Rein 12/15/2009 12:28:53  _____________________________________________________________________  External Attachment:    Type:   Image     Comment:   External Document

## 2010-02-24 NOTE — Cardiovascular Report (Signed)
Summary: [facility]   Imported By: Roderic Ovens 12/10/2009 15:54:07  _____________________________________________________________________  External Attachment:    Type:   Image     Comment:   External Document

## 2010-02-24 NOTE — Letter (Signed)
Summary: ER Notification  [facility], Main Office  [address]   [place], [place] [address]   Phone: [phone]  Fax: [phone]    November 10, 2009 12:39 PM  Marilyn Hernandez  The above referenced patient has been advised to report directly to the Emergency Room. Please see below for more information:  Dx: _shortness of breath___________     Private Vehicle  ____X___________ or EMS:  ________________   Orders:  Yes ______ or No  __x_____   Notify upon arrival:     Trish [phone]       Or _________________   Thank you,    [facility] Staff

## 2010-02-24 NOTE — Op Note (Signed)
Summary: [facility]   Imported By: Roderic Ovens 12/10/2009 15:51:30  _____________________________________________________________________  External Attachment:    Type:   Image     Comment:   External Document

## 2010-02-24 NOTE — Progress Notes (Signed)
Summary: calling re med for fluid on her lungs  Phone Note Call from Patient   Caller: Patient [phone] Reason for Call: Talk to Nurse Summary of Call: pt calling re xrays showing fluid on her lungs, was given med for three days, now what to do, still having problems Initial call taken by: Glynda Jaeger,  November 07, 2009 9:52 AM  Follow-up for Phone Call        pt can tell lasix has helped a little but feel like she still needs it for a little while longer, SOB is a little better but still a problem advised to cont. lasix and potassium over weekend if she felt she needed it and to call back early next week w/update Meredith Staggers, RN  November 07, 2009 10:11 AM

## 2010-02-24 NOTE — Progress Notes (Signed)
Summary: pt calling re PT  Phone Note Call from Patient   Caller: Patient [phone] or [phone] Reason for Call: Talk to Nurse Summary of Call: pt calling re appt monday, was told we would get back with her re pt-pls call Initial call taken by: Glynda Jaeger,  November 06, 2009 2:54 PM  Follow-up for Phone Call        Spoke with pt. Patient was calling to ask regarding cardiac rehab referral. I let pt. know the rehab center will contact her for orientation, and set the scheduled for cardiac re-hab. Pt. verbalized understanding. Follow-up by: Ollen Gross, RN, BSN,  November 06, 2009 3:38 PM

## 2010-02-24 NOTE — Assessment & Plan Note (Signed)
Summary: eph   Referring Mily Malecki:  Hochrein Primary Marilyn Hernandez:  Dr. Nathanial Rancher   History of Present Illness: Primary Cardiologist:  Dr. Rollene Rotunda Pulmonologist:  Dr. Vassie Loll Rheumatologist:  Dr. Lurena Nida  Marilyn Hernandez is a 60 yo female with a h/o rheumatoid arthritis, CAD, s/p NSTEMI in 09/2009 followed by subsequent CABG which was complicated by post-op AFib. She was treated with amiodarone for a short time.    She has had a complicated course since her CABG.  She was admx 10/17 with increased dyspnea.  She was noted to have bilateral pleural effusions, severe COPD on PFTs and moderate restrictive disease on PFTs thought to be related to obesity and her pleural effusions.  She was noted to have pulmonary nodules as well.  Echo on 10/18 demonstrated normal LVF.  Fluroscopy demonstrated limited ROM of the left hemidiaphragm but no signs of paralysis.  She was treated with a combination of increased lasix and prednisone.  Thoracentesis was not felt to be necessary.  In f/u with Dr. Vassie Loll, it was felt that she most likely has pleurisy and continued treatment with prednisone was recommended.  Of note, her Amiodarone was discontinued.  She then was admitted to [facility] with unexplained syncope on 12/10/2009.  Repeat Echo demonstrated and EF of 55-60% with normal RVF.  Follow up chest CT demonstrated no pulmonary embolism, decreased pericardial effusion, resolved right effusion, moderate left pleural effusion, nodules in the LUL and RLL and new ground glass opacities thought to represent inflammation or infection.   She had normal cardiac enzymes and no arrhythmias.  Her head CT was unremarkable.  She was seen by Dr. Eden Emms and it was felt she may have had a vasovagal episode.  Of note, she had a recent flare of her RA with changes made in her prednisone and methotrexate.  Her main complaint today is pain in her MCPJs due to an RA flare.  She is no longer on MTX.  She is on prednisone 10 mg once  daily.  She does not see Dr. Dareen Piano for 1 week.  She has occ. sharp CP.  Sleeps on 2 pillows.  No PND.  No palps.  No syncope.  Current Medications (verified): 1)  Fish Oil 1000 Mg Caps (Omega-3 Fatty Acids) .... Up To 3 Tabs Daily 2)  Glucosamine-Chondroitin  Caps (Glucosamine-Chondroit-Vit C-Mn) .Marland Kitchen.. 1 By Mouth Daily 3)  Eleviv .Marland Kitchen.. 1 By Mouth Daily 4)  Aspirin 81 Mg  Tabs (Aspirin) .Marland Kitchen.. 1 By Mouth Daily 5)  Folic Acid 800 Mcg Tabs (Folic Acid) .Marland Kitchen.. 1 By Mouth Daily 6)  Vitamin D3 2000 Unit Caps (Cholecalciferol) .Marland Kitchen.. 1 By Mouth Daily 7)  Probiotic  Caps (Probiotic Product) .Marland Kitchen.. 1 By Mouth Daily 8)  Metoprolol Tartrate 25 Mg Tabs (Metoprolol Tartrate) .... 1/2 By Mouth Two Times A Day 9)  Oxycodone Hcl 5 Mg Tabs (Oxycodone Hcl) .... Two Times A Day As Needed 10)  Optic-Vites  Tabs (Multiple Vitamins-Minerals) .... Opti-Vue--- Take 1 Tablet By Mouth Once A Day 11)  Stool Softener 100 Mg Caps (Docusate Sodium) .Marland Kitchen.. 1 By Mouth Daily 12)  Multitrace-4 [phone] Mcg/ml Soln (Trace Minerals Cr-Cu-Mn-Zn) .... 30 Drops Daily 13)  Calcium Magnesium 500-100-500 Liqd (Calcium-Magnesium-Vitamin D) .Marland Kitchen.. 1 By Mouth Daily 14)  Xango .... As Directed 15)  Furosemide 20 Mg Tabs (Furosemide) .... One Daily or As Directed 16)  Potassium Chloride Crys Cr 10 Meq Cr-Tabs (Potassium Chloride Crys Cr) .... One Daily With Furosemide or As Directed 17)  Advair  Diskus 250-50 Mcg/dose Aepb (Fluticasone-Salmeterol) .... Inhale 1 Puff Two Times A Day 18)  Prednisone 10 Mg Tabs (Prednisone) .... Once Daily With Food 19)  Nitrostat 0.4 Mg Subl (Nitroglycerin) .... As Needed 20)  Acetaminophen 325 Mg  Tabs (Acetaminophen) .... As Needed  Allergies (verified): 1)  ! * Aleve  Past History:  Past Medical History: Hyperlipidemia (new) CAD    a.  s/p NSTEMI 09/2009   b.  s/p CABG 09/2009 [facility] [place], [place]) Echo 12/12/2009:  EF 55-60%, normal RVF Pleural Effusions post CABG  RA COPD Pulmonary Nodules   a.   needs f/u CT in 5.2012  Past Surgical History: Reviewed history from 11/03/2009 and no changes required. CABG (LIMA to the LAD, SVG right coronary artery, SVG to obtuse marginal September 2011 [facility]) C-Section x2 Arthroscopic knee surgery Stapedectomy C-Sections x2  Vital Signs:  Patient profile:   60 year old female Height:      72 inches Weight:      201 pounds BMI:     27.36 Pulse rate:   72 / minute Resp:     16 per minute BP sitting:   104 / 68  (right arm)  Vitals Entered By: Marrion Coy, CNA (December 23, 2009 8:33 AM)  Physical Exam  General:  Well nourished, well developed, in no acute distress HEENT: normal Neck: no JVD at 90 degrees Cardiac:  normal S1, S2; RRR; no murmur; no rub Lungs:  decreased breath sounds at left base with assoc egophony; otherwise clear without wheezing. Abd: soft, nontender, no hepatomegaly Ext: trace edema MSK:  noticeable evidence of synovitis bilat hands with ulnar deviation of MCPJs. Skin: warm and dry Neuro:  CNs 2-12 intact, no focal abnormalities noted    Impression & Recommendations:  Problem # 1:  CORONARY ATHEROSCLEROSIS NATIVE CORONARY ARTERY (ICD-414.01)  Continue ASA and beta blocker. Overall stable.  Orders: TLB-BMP (Basic Metabolic Panel-BMET) (80048-METABOL)  Problem # 2:  HYPERCHOLESTEROLEMIA (ICD-272.0) She is back on full dose Simva after Amio was d/c'd. Check FLP and LFTs in 2 months.  Problem # 3:  PLEURAL EFFUSION, LEFT (ICD-511.9)  Closely followed by pulmonology. Slowly resolving.  She is improving in terms of dyspnea.   She continues on prednisone. We had a long conversation today about her post op course and I answered all of her questions. She continues on Lasix.  Will check BMET today to reassess K+ and renal fxn.  Problem # 4:  RHEUMATOID ARTHRITIS (ICD-714.0) She is now off MTX and is having significant pain and disability. She does not see Dr. Dareen Piano until 12/7.   I  spoke to Dr. Dareen Piano by phone.  He will see her in the next 1-2 days in follow up and will call her with an appt.  Problem # 5:  FIBRILLATION, ATRIAL (ICD-427.31)  Now off Amio without further documentation of recurrence.  Problem # 6:  SYNCOPE, HX OF (ICD-V12.49) Workup was negative in the hospital.  She likely had a vasovagal episode. No further testing at this time.  Patient Instructions: 1)  Your physician recommends that you schedule a follow-up appointment in: 8 weeks with Dr.Hochrein 2)  Your physician recommends that you return for lab work [id number] today. 3)  Dr. Ewell Poe office will call pt. in a day of two for an appointment. 4)  Your physician recommends that you continue on your current medications as directed. Please refer to the Current Medication list given to you today. 5)  Your physician recommends that  you return for a FASTING lipid profile: Fasting Lipids and LFT's in 8 weeks.272.0

## 2010-02-25 ENCOUNTER — Encounter (HOSPITAL_COMMUNITY): Payer: PRIVATE HEALTH INSURANCE | Attending: Cardiology

## 2010-02-25 DIAGNOSIS — Z7982 Long term (current) use of aspirin: Secondary | ICD-10-CM | POA: Insufficient documentation

## 2010-02-25 DIAGNOSIS — I2510 Atherosclerotic heart disease of native coronary artery without angina pectoris: Secondary | ICD-10-CM | POA: Insufficient documentation

## 2010-02-25 DIAGNOSIS — E669 Obesity, unspecified: Secondary | ICD-10-CM | POA: Insufficient documentation

## 2010-02-25 DIAGNOSIS — I4891 Unspecified atrial fibrillation: Secondary | ICD-10-CM | POA: Insufficient documentation

## 2010-02-25 DIAGNOSIS — Z5189 Encounter for other specified aftercare: Secondary | ICD-10-CM | POA: Insufficient documentation

## 2010-02-25 DIAGNOSIS — Z951 Presence of aortocoronary bypass graft: Secondary | ICD-10-CM | POA: Insufficient documentation

## 2010-02-25 DIAGNOSIS — E7800 Pure hypercholesterolemia, unspecified: Secondary | ICD-10-CM | POA: Insufficient documentation

## 2010-02-25 DIAGNOSIS — E785 Hyperlipidemia, unspecified: Secondary | ICD-10-CM | POA: Insufficient documentation

## 2010-02-25 DIAGNOSIS — M069 Rheumatoid arthritis, unspecified: Secondary | ICD-10-CM | POA: Insufficient documentation

## 2010-02-26 NOTE — Letter (Signed)
Summary: [facility] Extended Visit Note   [facility] Extended Visit Note   Imported By: Roderic Ovens 02/05/2010 16:31:01  _____________________________________________________________________  External Attachment:    Type:   Image     Comment:   External Document

## 2010-02-26 NOTE — Letter (Signed)
Summary: [facility]   Imported By: Marylou Mccoy 01/29/2010 16:06:55  _____________________________________________________________________  External Attachment:    Type:   Image     Comment:   External Document

## 2010-02-26 NOTE — Letter (Signed)
Summary: cardiac rehab order  [facility] [employer], Main Office  [address]   [place], [place] [address]   Phone: [phone]  Fax: [phone]            January 05, 2010 MRN: [id number]    RE:   Marilyn Hernandez   [address]   [place], [place]  [address]   To Cardiac Rehab,   Pt may return to cardiac rehab as ordered.     Sincerely,        V.O. Dr Fayrene Fearing Hochrein/Pamela Fleming-Hayes, RN  This letter has been electronically signed by your physician.

## 2010-02-26 NOTE — Assessment & Plan Note (Signed)
Summary: 8WK F/U WITH FASTING LAB.SL   Visit Type:  Follow-up Primary Provider:  Dr. Nathanial Rancher  CC:  CAD.  History of Present Illness: The patient presents for followup of coronary disease status post bypass grafting. Since I saw her she has done well from a cardiovascular standpoint. She has had a flare of her arthritis and has had to interrupt cardiac rehabilitation. She is about to get back to this. She's had some fleeting chest discomfort and palpitations but nothing similar to previous angina. She's had no substernal chest pressure, neck or arm discomfort. She has had no presyncope or syncope. She has had no weight gain or edema. She has mentioned hot flashes and tearfulness though no other symptoms consistent with depression.  Current Medications (verified): 1)  Fish Oil 1000 Mg Caps (Omega-3 Fatty Acids) .... Up To 3 Tabs Daily 2)  Glucosamine-Chondroitin  Caps (Glucosamine-Chondroit-Vit C-Mn) .Marland Kitchen.. 1 By Mouth Daily 3)  Eleviv .Marland Kitchen.. 1 By Mouth Daily 4)  Aspirin 81 Mg  Tabs (Aspirin) .Marland Kitchen.. 1 By Mouth Daily 5)  Folic Acid 800 Mcg Tabs (Folic Acid) .Marland Kitchen.. 1 By Mouth Daily 6)  Vitamin D3 2000 Unit Caps (Cholecalciferol) .Marland Kitchen.. 1 By Mouth Daily 7)  Probiotic  Caps (Probiotic Product) .Marland Kitchen.. 1 By Mouth Daily 8)  Metoprolol Tartrate 25 Mg Tabs (Metoprolol Tartrate) .... 1/2 By Mouth Two Times A Day 9)  Simvastatin 20 Mg Tabs (Simvastatin) .... Take 1 Tablet By Mouth Once A Day 10)  Optic-Vites  Tabs (Multiple Vitamins-Minerals) .... Opti-Vue--- Take 1 Tablet By Mouth Once A Day 11)  Stool Softener 100 Mg Caps (Docusate Sodium) .Marland Kitchen.. 1 By Mouth Daily 12)  Multitrace-4 [phone] Mcg/ml Soln (Trace Minerals Cr-Cu-Mn-Zn) .... 30 Drops Daily 13)  Calcium Magnesium 500-100-500 Liqd (Calcium-Magnesium-Vitamin D) .Marland Kitchen.. 1 By Mouth Daily 14)  Xango .... As Directed 15)  Furosemide 20 Mg Tabs (Furosemide) .... One Daily or As Directed 16)  Potassium Chloride Crys Cr 10 Meq Cr-Tabs (Potassium Chloride Crys  Cr) .... One Daily With Furosemide or As Directed 17)  Nitrostat 0.4 Mg Subl (Nitroglycerin) .... As Needed 18)  Acetaminophen 325 Mg  Tabs (Acetaminophen) .... As Needed 19)  Leflunomide 10 Mg Tabs (Leflunomide) .Marland Kitchen.. 1 By Mouth Daily 20)  Prednisone 10 Mg Tabs (Prednisone) .... Uad  Allergies (verified): 1)  ! * Aleve  Past History:  Past Medical History: Reviewed history from 12/23/2009 and no changes required. Hyperlipidemia (new) CAD    a.  s/p NSTEMI 09/2009   b.  s/p CABG 09/2009 [facility] [place], [place]) Echo 12/12/2009:  EF 55-60%, normal RVF Pleural Effusions post CABG  RA COPD Pulmonary Nodules   a.  needs f/u CT in 5.2012  Past Surgical History: Reviewed history from 11/03/2009 and no changes required. CABG (LIMA to the LAD, SVG right coronary artery, SVG to obtuse marginal September 2011 [facility]) C-Section x2 Arthroscopic knee surgery Stapedectomy C-Sections x2  Review of Systems       As stated in the HPI and negative for all other systems.   Vital Signs:  Patient profile:   60 year old female Height:      72 inches Weight:      207 pounds BMI:     28.18 Pulse rate:   76 / minute Resp:     16 per minute BP sitting:   141 / 78  (right arm)  Vitals Entered By: Marrion Coy, CNA (February 17, 2010 10:30 AM)  Physical Exam  General:  Well developed, well  nourished, in no acute distress. Head:  normocephalic and atraumatic Eyes:  PERRLA/EOM intact; conjunctiva and lids normal. Neck:  Neck supple, no JVD. No masses, thyromegaly or abnormal cervical nodes. Chest Wall:  Well-healed sternotomy scar Lungs:  Clear bilaterally to auscultation and percussion. Abdomen:  Bowel sounds positive; abdomen soft and non-tender without masses, organomegaly, or hernias noted. No hepatosplenomegaly. Msk:  Back normal, normal gait. Muscle strength and tone normal. Extremities:  Mild left leg edema with well-healed saphenous vein graft harvest sites Neurologic:   Alert and oriented x 3. Skin:  Intact without lesions or rashes. Cervical Nodes:  no significant adenopathy Inguinal Nodes:  no significant adenopathy Psych:  Slightly emotional but appropriate   Detailed Cardiovascular Exam  Neck    Carotids: Carotids full and equal bilaterally without bruits.      Neck Veins: Normal, no JVD.    Heart    Inspection: no deformities or lifts noted.      Palpation: normal PMI with no thrills palpable.      Auscultation: regular rate and rhythm, S1, S2 without murmurs, rubs, gallops, or clicks.    Vascular    Abdominal Aorta: no palpable masses, pulsations, or audible bruits.      Femoral Pulses: normal femoral pulses bilaterally.      Pedal Pulses: normal pedal pulses bilaterally.      Radial Pulses: normal radial pulses bilaterally.      Peripheral Circulation: no clubbing, cyanosis, or edema noted with normal capillary refill.     Impression & Recommendations:  Problem # 1:  CORONARY ARTERY BYPASS GRAFT, HX OF (ICD-V45.81) She is doing well from a cardiovascular standpoint. No change in therapy is indicated. Of note she has had none of the syncope which prompted her last hospitalization and was likely vasovagal.  Problem # 2:  HYPERCHOLESTEROLEMIA (ICD-272.0) She will come back for fasting lipid profile and I will treat accordingly.  Problem # 3:  FIBRILLATION, ATRIAL (ICD-427.31) She has had no symptomatic recurrence. No change in therapy is indicated.  Patient Instructions: 1)  Your physician recommends that you schedule a follow-up appointment in: 4 months with Dr Antoine Poche 2)  Your physician recommends that you continue on your current medications as directed. Please refer to the Current Medication list given to you today. 3)  Your physician recommends that you return for a FASTING lipid and profile: 272.0 v58.69 Prescriptions: POTASSIUM CHLORIDE CRYS CR 10 MEQ CR-TABS (POTASSIUM CHLORIDE CRYS CR) one daily with Furosemide or as directed   #30 x 6   Entered by:   Charolotte Capuchin, RN   Authorized by:   Rollene Rotunda, MD, [facility]   Signed by:   Charolotte Capuchin, RN on 02/17/2010   Method used:   Electronically to        [facility] (retail)       [address]       [place], [place]  [address]       Ph: [phone] or [phone]       Fax: [phone]   RxID:   [id number] SIMVASTATIN 20 MG TABS (SIMVASTATIN) Take 1 tablet by mouth once a day  #30 x 11   Entered by:   Charolotte Capuchin, RN   Authorized by:   Rollene Rotunda, MD, [facility]   Signed by:   Charolotte Capuchin, RN on 02/17/2010   Method used:   Electronically to        [facility] (  retail)       [address]       [place], [place]  [address]       Ph: [phone] or [phone]       Fax: [phone]   RxID:   [id number]

## 2010-02-26 NOTE — Progress Notes (Signed)
Summary: can pt return to cardiac rehab  Phone Note From Other Clinic   Caller: Patient Caller: [phone] [facility] Summary of Call: pt was dc 11-18/pt has syncope-no known cause-has appt here 11-29, does she need to rtn to rehab before then or to wait until seen to restart if needed? Initial call taken by: Glynda Jaeger,  December 16, 2009 3:19 PM  Follow-up for Phone Call        Spoke with pt about how is she feeling.  States she saw Dr Dareen Piano yesterday and he gave her a shot and started her on prednisone.  She reports feeling much, much better now and is able to move are arms and legs without pain.  She states she feels that she is strong enough to return to cardiac rehab if OK with Dr Antoine Poche.  Pt aware I will forward this information to MD for his review and approval of her returning to cardiac rehab.  If approval given - a note will be faxed to them so that she will be able to resume. Follow-up by: Charolotte Capuchin, RN,  December 25, 2009 5:13 PM  Additional Follow-up for Phone Call Additional follow up Details #1::        OK to start rehab Additional Follow-up by: Rollene Rotunda, MD, [facility],  December 28, 2009 9:58 PM    Additional Follow-up for Phone Call Additional follow up Details #2::    order faxed to cardiac rehab Follow-up by: Charolotte Capuchin, RN,  January 05, 2010 3:12 PM

## 2010-02-26 NOTE — Miscellaneous (Signed)
Summary: [facility] Progress Note   [facility]   Imported By: Roderic Ovens 02/16/2010 15:56:00  _____________________________________________________________________  External Attachment:    Type:   Image     Comment:   External Document

## 2010-02-27 ENCOUNTER — Encounter (HOSPITAL_COMMUNITY): Payer: PRIVATE HEALTH INSURANCE

## 2010-03-02 ENCOUNTER — Telehealth (INDEPENDENT_AMBULATORY_CARE_PROVIDER_SITE_OTHER): Payer: Self-pay | Admitting: *Deleted

## 2010-03-02 ENCOUNTER — Encounter (HOSPITAL_COMMUNITY): Payer: PRIVATE HEALTH INSURANCE

## 2010-03-04 ENCOUNTER — Encounter (HOSPITAL_COMMUNITY): Payer: PRIVATE HEALTH INSURANCE

## 2010-03-06 ENCOUNTER — Encounter (HOSPITAL_COMMUNITY): Payer: PRIVATE HEALTH INSURANCE

## 2010-03-09 ENCOUNTER — Encounter (HOSPITAL_COMMUNITY): Payer: PRIVATE HEALTH INSURANCE

## 2010-03-11 ENCOUNTER — Encounter (HOSPITAL_COMMUNITY): Payer: PRIVATE HEALTH INSURANCE

## 2010-03-11 ENCOUNTER — Other Ambulatory Visit: Payer: Self-pay | Admitting: Pulmonary Disease

## 2010-03-11 ENCOUNTER — Ambulatory Visit (INDEPENDENT_AMBULATORY_CARE_PROVIDER_SITE_OTHER)
Admission: RE | Admit: 2010-03-11 | Discharge: 2010-03-11 | Disposition: A | Discharge status: Home / Self Care | Payer: PRIVATE HEALTH INSURANCE | Source: Ambulatory Visit | Attending: Pulmonary Disease | Admitting: Pulmonary Disease

## 2010-03-11 ENCOUNTER — Encounter: Payer: Self-pay | Admitting: Pulmonary Disease

## 2010-03-11 ENCOUNTER — Ambulatory Visit (INDEPENDENT_AMBULATORY_CARE_PROVIDER_SITE_OTHER): Payer: PRIVATE HEALTH INSURANCE | Admitting: Pulmonary Disease

## 2010-03-11 DIAGNOSIS — J90 Pleural effusion, not elsewhere classified: Secondary | ICD-10-CM

## 2010-03-11 DIAGNOSIS — M069 Rheumatoid arthritis, unspecified: Secondary | ICD-10-CM

## 2010-03-12 NOTE — Progress Notes (Signed)
Summary: prescription for advir and chest xray prior to 2/15 visit  Phone Note Call from Patient   Caller: Patient Call For: ALVA Summary of Call: Patient has a prescription for Advir diskus from Dr Clifton James she called in the refill request to [facility] [phone]. She has enough to last tonight but won't have any for tomorrow. She wants to know if Dr. Vassie Loll would be willing to refill this or give her a new prescription. Also we scheduled patient for 03/11/10 with Dr Vassie Loll and she thinks that she was to have a chest xray prior but isnt sure. Patient can be reached at [phone] or cell [phone] Initial call taken by: Vedia Coffer,  March 02, 2010 9:54 AM  Follow-up for Phone Call        [facility] Vernie Murders  March 02, 2010 11:02 AM  Order was placed in EMR for cxr already for cxr on 2/15. ? advair not on med list, when did she start?, what strength?Vernie Murders  March 02, 2010 11:04 AM   Additional Follow-up for Phone Call Additional follow up Details #1::        Patient called back.  Please return call at [phone]. Additional Follow-up by: Leonette Monarch,  March 02, 2010 1:44 PM    Additional Follow-up for Phone Call Additional follow up Details #2::    [facility] Vernie Murders  March 02, 2010 2:22 PM  Spoke with pt and advised that order for xray was already placed. I also see where she was given rx for advair 250 at last ov, so I refilled this for her.  Nothing firther needed.  Follow-up by: Vernie Murders,  March 02, 2010 2:27 PM  New/Updated Medications: ADVAIR DISKUS 250-50 MCG/DOSE AEPB (FLUTICASONE-SALMETEROL) inhale 1 puff two times a day, rinse mouth after each use Prescriptions: ADVAIR DISKUS 250-50 MCG/DOSE AEPB (FLUTICASONE-SALMETEROL) inhale 1 puff two times a day, rinse mouth after each use  #1 x 1   Entered by:   Vernie Murders   Authorized by:   Comer Locket. Vassie Loll MD   Signed by:   Vernie Murders on 03/02/2010   Method used:   Electronically to        [facility] (retail)       [address]       [place], [place]  [address]       Ph: [phone] or [phone]       Fax: [phone]   RxID:   [id number]

## 2010-03-13 ENCOUNTER — Encounter (HOSPITAL_COMMUNITY): Payer: PRIVATE HEALTH INSURANCE

## 2010-03-16 ENCOUNTER — Encounter (HOSPITAL_COMMUNITY): Payer: PRIVATE HEALTH INSURANCE

## 2010-03-18 ENCOUNTER — Encounter (HOSPITAL_COMMUNITY): Payer: PRIVATE HEALTH INSURANCE

## 2010-03-18 NOTE — Assessment & Plan Note (Addendum)
Summary: FEB ROV PER REMINDER LETTER//SH   Visit Type:  Follow-up Copy to:  [facility] Primary Provider/Referring Provider:  Dr. Nathanial Rancher  CC:  Follow up with cxr.  History of Present Illness: 59/F, never smoker with RA , for post hospital FU of pleural effusions post CABG  s/p emergent CABG x 3 in sep '11 @ [facility]  10/17-19/11 with rt chest pain & dyspnea, CT chest cardiomegaly and bilateral pleural effusions/bibasilar atelectasis.  Bilateral sub -cm pulmonary nodules.  Mild right hilar adenopathy. Likely reactive related to congestive heart failure.    Chest fluoroscopy:  Limited motion of the left hemidiaphragm, no paradoxical motion to suggest paralysis.  2D echocardiogram :  LVEF 60- 65%.  Features consistent with grade 2 diastolic dysfunction.  Ventricular septum motion showed abnormal function and dissymmetry, consistent with a post thoracotomy state. PFTs >> Severe airway obstruction per PFTs. Moderately severe restrictive - parenchymal on PFTs (likely secondary to obesity and bilateral pleural effusions as well as impaired left diaphragm excursion). Given 20 mg pred x 5 ds, ON advair  & lasix , Just started rehab   December 17, 2009 11:24 AM  Hosp [facility] - dizzy & faint, hot & flushed, woke up on the lfoor - echo nml, CT angio neg PE , decreased pericardial effusion, rt pleural effusion - resolved, lt persists small with layering on decub film -IMPROVED from last month   March 11, 2010 2:51 PM  rremains on 10 mg pred, leflunomide 15 mg started instead of MTx has started cardiac rehab, c/o hip pain radiating to LLE, CXR clearing of effusions  Preventive Screening-Counseling & Management  Alcohol-Tobacco     Alcohol drinks/day: 0     Smoking Status: never  Current Medications (verified): 1)  Fish Oil 1000 Mg Caps (Omega-3 Fatty Acids) .... Up To 3 Tabs Daily 2)  Glucosamine-Chondroitin  Caps (Glucosamine-Chondroit-Vit C-Mn) .Marland Kitchen.. 1 By Mouth Daily 3)  Eleviv .... 2 By  Mouth Daily 4)  Aspirin 81 Mg  Tabs (Aspirin) .Marland Kitchen.. 1 By Mouth Daily 5)  Folic Acid 800 Mcg Tabs (Folic Acid) .Marland Kitchen.. 1 By Mouth Daily 6)  Vitamin D3 2000 Unit Caps (Cholecalciferol) .Marland Kitchen.. 1 By Mouth Daily 7)  Probiotic  Caps (Probiotic Product) .Marland Kitchen.. 1 By Mouth Daily 8)  Metoprolol Tartrate 25 Mg Tabs (Metoprolol Tartrate) .... 1/2 By Mouth Two Times A Day 9)  Simvastatin 20 Mg Tabs (Simvastatin) .... Take 1 Tablet By Mouth Once A Day 10)  Stool Softener 100 Mg Caps (Docusate Sodium) .Marland Kitchen.. 1 By Mouth Daily 11)  Calcium Magnesium 500-100-500 Liqd (Calcium-Magnesium-Vitamin D) .Marland Kitchen.. 1 By Mouth Daily 12)  Xango .... As Directed 13)  Furosemide 20 Mg Tabs (Furosemide) .... Take 1 Tablet By Mouth Once A Day 14)  Potassium Chloride Crys Cr 10 Meq Cr-Tabs (Potassium Chloride Crys Cr) .... One Daily With Furosemide or As Directed 15)  Nitrostat 0.4 Mg Subl (Nitroglycerin) .... As Needed 16)  Acetaminophen 325 Mg  Tabs (Acetaminophen) .... As Needed 17)  Leflunomide 10 Mg Tabs (Leflunomide) .... Take 1  1/2 Tablet By Mouth Once A Day 18)  Prednisone 10 Mg Tabs (Prednisone) .... Uad 19)  Advair Diskus 250-50 Mcg/dose Aepb (Fluticasone-Salmeterol) .... Inhale 1 Puff Two Times A Day, Rinse Mouth After Each Use  Allergies (verified): 1)  ! * Aleve  Past History:  Past Medical History: Last updated: 12/23/2009 Hyperlipidemia (new) CAD    a.  s/p NSTEMI 09/2009   b.  s/p CABG 09/2009 [facility] [place], [place]) Echo  12/12/2009:  EF 55-60%, normal RVF Pleural Effusions post CABG  RA COPD Pulmonary Nodules   a.  needs f/u CT in 5.2012  Social History: Last updated: 11/03/2009 The patient has never smoked cigarettes.  She does not drink alcohol.  She is married with 2 children.  Review of Systems  The patient denies anorexia, fever, weight loss, weight gain, vision loss, decreased hearing, hoarseness, chest pain, syncope, dyspnea on exertion, peripheral edema, prolonged cough, headaches, hemoptysis, abdominal  pain, melena, hematochezia, severe indigestion/heartburn, hematuria, muscle weakness, suspicious skin lesions, difficulty walking, depression, unusual weight change, abnormal bleeding, enlarged lymph nodes, and angioedema.    Vital Signs:  Patient profile:   60 year old female Height:      72 inches Weight:      213.6 pounds BMI:     29.07 O2 Sat:      95 % on Room air Temp:     98.1 degrees F oral Pulse rate:   83 / minute BP sitting:   118 / 72  (left arm) Cuff size:   regular  Vitals Entered By: Zackery Barefoot CMA (March 11, 2010 2:29 PM)  O2 Flow:  Room air CC: Follow up with cxr Comments Medications reviewed with patient Verified contact number and pharmacy with patient Zackery Barefoot CMA  March 11, 2010 2:29 PM    Physical Exam  Additional Exam:  Gen. Pleasant, well-nourished, in no distress, normal affect ENT - no lesions, no post nasal drip Neck: No JVD, no thyromegaly, no carotid bruits Lungs: no use of accessory muscles, no dullness to percussion, lt basal crackles, no rhonchi  Cardiovascular: Rhythm regular, heart sounds  normal, no murmurs or gallops, no peripheral edema Musculoskeletal: No deformities, no cyanosis or clubbing      Impression & Recommendations:  Problem # 1:  PLEURAL EFFUSION, LEFT (ICD-511.9) resolvde on imaging Sub cm pulmonay nodules noted on CT likely infectious/ inflammatory in this never smoker & do not need FU Orders: T-2 View CXR (71020TC) Est. Patient Level III ([address])  Problem # 2:  RHEUMATOID ARTHRITIS (ICD-714.0)  Airway obstruction on spirometry remains unexplained , she doe not have a h/o asthma- I have asked her to stop advair & if recurrent symptoms then will re-evaluate, otherwise, she will return as needed only  Orders: Est. Patient Level III ([address])  Medications Added to Medication List This Visit: 1)  Eleviv  .... 2 by mouth daily 2)  Furosemide 20 Mg Tabs (Furosemide) .... Take 1 tablet by mouth once a  day 3)  Leflunomide 10 Mg Tabs (Leflunomide) .... Take 1  1/2 tablet by mouth once a day  Patient Instructions: 1)  Copy sent to: Dr Durenda Age, dr hochrein 2)  chest xray has cleared 3)  Take advair once daily until done, then stop

## 2010-03-20 ENCOUNTER — Encounter: Payer: Self-pay | Admitting: Cardiology

## 2010-03-20 ENCOUNTER — Encounter (HOSPITAL_COMMUNITY): Payer: PRIVATE HEALTH INSURANCE

## 2010-03-23 ENCOUNTER — Encounter (HOSPITAL_COMMUNITY): Payer: PRIVATE HEALTH INSURANCE

## 2010-03-24 NOTE — Miscellaneous (Addendum)
Summary: Simvastatin 40 mg  Clinical Lists Changes  Medications: Changed medication from SIMVASTATIN 20 MG TABS (SIMVASTATIN) Take 1 tablet by mouth once a day to SIMVASTATIN 40 MG TABS (SIMVASTATIN) one every evening - Signed Rx of SIMVASTATIN 40 MG TABS (SIMVASTATIN) one every evening;  #30 x 6;  Signed;  Entered by: Charolotte Capuchin, RN;  Authorized by: Rollene Rotunda, MD, [facility];  Method used: Electronically to [facility] [address] [place], [place], [place]  [address], Ph: [phone] or [phone], Fax: [phone]    Prescriptions: SIMVASTATIN 40 MG TABS (SIMVASTATIN) one every evening  #30 x 6   Entered by:   Charolotte Capuchin, RN   Authorized by:   Rollene Rotunda, MD, [facility]   Signed by:   Charolotte Capuchin, RN on 03/20/2010   Method used:   Electronically to        [facility] (retail)       [address]       [place], [place]  [address]       Ph: [phone] or [phone]       Fax: [phone]   RxID:   [id number]

## 2010-03-25 ENCOUNTER — Encounter (HOSPITAL_COMMUNITY): Payer: PRIVATE HEALTH INSURANCE

## 2010-03-26 ENCOUNTER — Telehealth: Payer: Self-pay | Admitting: Cardiology

## 2010-03-27 ENCOUNTER — Encounter (HOSPITAL_COMMUNITY): Payer: PRIVATE HEALTH INSURANCE

## 2010-03-30 ENCOUNTER — Encounter (HOSPITAL_COMMUNITY): Payer: PRIVATE HEALTH INSURANCE

## 2010-04-01 ENCOUNTER — Encounter (HOSPITAL_COMMUNITY): Payer: PRIVATE HEALTH INSURANCE

## 2010-04-02 NOTE — Progress Notes (Addendum)
Summary: pain in left leg and knee       call back 04/09/10  Phone Note Call from Patient Call back at Home Phone [phone]   Caller: Patient Summary of Call: Pt having pains in left and knee all started after changing Simvastatin few days ago Initial call taken by: Judie Grieve,  March 26, 2010 4:33 PM  Follow-up for Phone Call        in leg from foot to hip and esp in the knee joint, discomfort is only in left leg!   - has gotten alot worse since increasing simvastatin.  Explained to pt that we dont usually see side effects from simvastatin that are isolated to one area like that . Last night she used an ice pack on her knee and took oxycodone and it finally helped enough for her to sleep some.  She reports "not sleeping at all last night" because of the pain.  Pt wants to come off simvastatin - instructed her to hold simvastatin for 2 weeks and call us back to let us know how she is doing.  She states understanding and agrees. Follow-up by: Charolotte Capuchin, RN,  March 26, 2010 4:58 PM     Appended Document: pain in left leg and knee       call back 04/09/10 spoke with pt who states she saw Dr Dareen Piano last week who told her she had bursitis.  He gave her an injection and it has helped.  Pt will resume her simvastatin as ordered

## 2010-04-03 ENCOUNTER — Encounter (HOSPITAL_COMMUNITY): Payer: PRIVATE HEALTH INSURANCE

## 2010-04-06 ENCOUNTER — Encounter (HOSPITAL_COMMUNITY): Payer: PRIVATE HEALTH INSURANCE

## 2010-04-07 LAB — DIFFERENTIAL
Basophils Absolute: 0.1 10*3/uL (ref 0.0–0.1)
Eosinophils Absolute: 0.5 10*3/uL (ref 0.0–0.7)
Eosinophils Relative: 3 % (ref 0–5)
Lymphocytes Relative: 12 % (ref 12–46)
Lymphocytes Relative: 19 % (ref 12–46)
Lymphs Abs: 1.7 10*3/uL (ref 0.7–4.0)
Monocytes Absolute: 1.3 10*3/uL — ABNORMAL HIGH (ref 0.1–1.0)
Monocytes Relative: 10 % (ref 3–12)
Monocytes Relative: 9 % (ref 3–12)

## 2010-04-07 LAB — CK TOTAL AND CKMB (NOT AT ARMC): Total CK: 34 U/L (ref 7–177)

## 2010-04-07 LAB — CBC
HCT: 34.8 % — ABNORMAL LOW (ref 36.0–46.0)
HCT: 40.1 % (ref 36.0–46.0)
Hemoglobin: 11.1 g/dL — ABNORMAL LOW (ref 12.0–15.0)
Hemoglobin: 13.1 g/dL (ref 12.0–15.0)
MCH: 27.8 pg (ref 26.0–34.0)
MCV: 87 fL (ref 78.0–100.0)
MCV: 89.1 fL (ref 78.0–100.0)
RBC: 4 MIL/uL (ref 3.87–5.11)
RBC: 4.5 MIL/uL (ref 3.87–5.11)
WBC: 14.5 10*3/uL — ABNORMAL HIGH (ref 4.0–10.5)

## 2010-04-07 LAB — BASIC METABOLIC PANEL
Chloride: 103 mEq/L (ref 96–112)
GFR calc Af Amer: >60 mL/min (ref >60)
Potassium: 3.8 mEq/L (ref 3.5–5.1)

## 2010-04-07 LAB — LIPID PANEL
Cholesterol: 160 mg/dL (ref 0–200)
LDL Cholesterol: 99 mg/dL (ref 0–99)
Total CHOL/HDL Ratio: 3.6 RATIO

## 2010-04-07 LAB — PROTIME-INR: Prothrombin Time: 13.7 seconds (ref 11.6–15.2)

## 2010-04-07 LAB — POCT CARDIAC MARKERS
CKMB, poc: <1 ng/mL — ABNORMAL LOW (ref 1.0–8.0)
Myoglobin, poc: 88.4 ng/mL (ref 12–200)
Myoglobin, poc: 96.5 ng/mL (ref 12–200)

## 2010-04-07 LAB — CARDIAC PANEL(CRET KIN+CKTOT+MB+TROPI)
CK, MB: 0.8 ng/mL (ref 0.3–4.0)
Total CK: 30 U/L (ref 7–177)
Troponin I: 0.02 ng/mL (ref 0.00–0.06)

## 2010-04-07 LAB — CULTURE, BLOOD (ROUTINE X 2)
Culture  Setup Time: 201111170849
Culture: NO GROWTH

## 2010-04-07 LAB — COMPREHENSIVE METABOLIC PANEL
BUN: 13 mg/dL (ref 6–23)
CO2: 24 mEq/L (ref 19–32)
Chloride: 104 mEq/L (ref 96–112)
Creatinine, Ser: 0.95 mg/dL (ref 0.4–1.2)
GFR calc non Af Amer: >60 mL/min (ref >60)
Glucose, Bld: 107 mg/dL — ABNORMAL HIGH (ref 70–99)
Total Bilirubin: 0.7 mg/dL (ref 0.3–1.2)

## 2010-04-08 ENCOUNTER — Encounter (HOSPITAL_COMMUNITY): Payer: PRIVATE HEALTH INSURANCE

## 2010-04-08 LAB — CBC
HCT: 36.4 % (ref 36.0–46.0)
HCT: 40.9 % (ref 36.0–46.0)
Hemoglobin: 11.6 g/dL — ABNORMAL LOW (ref 12.0–15.0)
Hemoglobin: 13.4 g/dL (ref 12.0–15.0)
MCHC: 32.8 g/dL (ref 30.0–36.0)
MCV: 89.9 fL (ref 78.0–100.0)
MCV: 90.3 fL (ref 78.0–100.0)
RBC: 4.03 MIL/uL (ref 3.87–5.11)
RDW: 13 % (ref 11.5–15.5)
WBC: 10.3 10*3/uL (ref 4.0–10.5)
WBC: 8.3 10*3/uL (ref 4.0–10.5)

## 2010-04-08 LAB — DIFFERENTIAL
Basophils Absolute: 0 10*3/uL (ref 0.0–0.1)
Basophils Relative: 0 % (ref 0–1)
Eosinophils Absolute: 0.2 10*3/uL (ref 0.0–0.7)
Monocytes Relative: 11 % (ref 3–12)
Neutro Abs: 7.5 10*3/uL (ref 1.7–7.7)
Neutrophils Relative %: 73 % (ref 43–77)

## 2010-04-08 LAB — CARDIAC PANEL(CRET KIN+CKTOT+MB+TROPI)
CK, MB: 0.8 ng/mL (ref 0.3–4.0)
Total CK: 33 U/L (ref 7–177)

## 2010-04-08 LAB — PROTIME-INR
INR: 1.05 (ref 0.00–1.49)
Prothrombin Time: 13.9 seconds (ref 11.6–15.2)

## 2010-04-08 LAB — TSH: TSH: 2.164 u[IU]/mL (ref 0.350–4.500)

## 2010-04-08 LAB — COMPREHENSIVE METABOLIC PANEL
ALT: 15 U/L (ref 0–35)
Alkaline Phosphatase: 76 U/L (ref 39–117)
BUN: 9 mg/dL (ref 6–23)
CO2: 28 mEq/L (ref 19–32)
GFR calc non Af Amer: 49 mL/min — ABNORMAL LOW (ref >60)
Glucose, Bld: 97 mg/dL (ref 70–99)
Potassium: 3.5 mEq/L (ref 3.5–5.1)
Total Protein: 7.8 g/dL (ref 6.0–8.3)

## 2010-04-08 LAB — BLOOD GAS, ARTERIAL
Bicarbonate: 25.1 mEq/L — ABNORMAL HIGH (ref 20.0–24.0)
O2 Saturation: 94.5 %
Patient temperature: 98.6
TCO2: 26.1 mmol/L (ref 0–100)
pH, Arterial: 7.477 — ABNORMAL HIGH (ref 7.350–7.400)

## 2010-04-08 LAB — BASIC METABOLIC PANEL
Chloride: 104 mEq/L (ref 96–112)
GFR calc non Af Amer: 48 mL/min — ABNORMAL LOW (ref >60)
Potassium: 3.7 mEq/L (ref 3.5–5.1)
Sodium: 140 mEq/L (ref 135–145)

## 2010-04-08 LAB — POCT CARDIAC MARKERS: Myoglobin, poc: 77.8 ng/mL (ref 12–200)

## 2010-04-08 LAB — SEDIMENTATION RATE: Sed Rate: 62 mm/hr — ABNORMAL HIGH (ref 0–22)

## 2010-04-08 LAB — BRAIN NATRIURETIC PEPTIDE: Pro B Natriuretic peptide (BNP): 89 pg/mL (ref 0.0–100.0)

## 2010-04-10 ENCOUNTER — Encounter (HOSPITAL_COMMUNITY): Payer: PRIVATE HEALTH INSURANCE

## 2010-04-13 ENCOUNTER — Encounter (HOSPITAL_COMMUNITY): Payer: PRIVATE HEALTH INSURANCE

## 2010-04-15 ENCOUNTER — Encounter (HOSPITAL_COMMUNITY): Payer: PRIVATE HEALTH INSURANCE

## 2010-04-17 ENCOUNTER — Encounter (HOSPITAL_COMMUNITY): Payer: PRIVATE HEALTH INSURANCE

## 2010-04-20 ENCOUNTER — Encounter (HOSPITAL_COMMUNITY): Payer: PRIVATE HEALTH INSURANCE

## 2010-04-22 ENCOUNTER — Encounter (HOSPITAL_COMMUNITY): Payer: PRIVATE HEALTH INSURANCE

## 2010-04-24 ENCOUNTER — Encounter (HOSPITAL_COMMUNITY): Payer: PRIVATE HEALTH INSURANCE

## 2010-04-27 ENCOUNTER — Encounter (HOSPITAL_COMMUNITY): Payer: PRIVATE HEALTH INSURANCE

## 2010-04-29 ENCOUNTER — Encounter (HOSPITAL_COMMUNITY): Payer: PRIVATE HEALTH INSURANCE

## 2010-05-01 ENCOUNTER — Encounter (HOSPITAL_COMMUNITY): Payer: PRIVATE HEALTH INSURANCE

## 2010-05-04 ENCOUNTER — Encounter (HOSPITAL_COMMUNITY): Payer: PRIVATE HEALTH INSURANCE

## 2010-05-06 ENCOUNTER — Encounter (HOSPITAL_COMMUNITY): Payer: PRIVATE HEALTH INSURANCE

## 2010-05-08 ENCOUNTER — Encounter (HOSPITAL_COMMUNITY): Payer: PRIVATE HEALTH INSURANCE

## 2010-05-11 ENCOUNTER — Encounter (HOSPITAL_COMMUNITY): Payer: PRIVATE HEALTH INSURANCE

## 2010-05-13 ENCOUNTER — Encounter (HOSPITAL_COMMUNITY): Payer: PRIVATE HEALTH INSURANCE

## 2010-05-15 ENCOUNTER — Encounter (HOSPITAL_COMMUNITY): Payer: PRIVATE HEALTH INSURANCE

## 2010-05-18 ENCOUNTER — Encounter (HOSPITAL_COMMUNITY): Payer: PRIVATE HEALTH INSURANCE

## 2010-05-19 ENCOUNTER — Other Ambulatory Visit (INDEPENDENT_AMBULATORY_CARE_PROVIDER_SITE_OTHER): Payer: PRIVATE HEALTH INSURANCE | Admitting: *Deleted

## 2010-05-19 DIAGNOSIS — Z79899 Other long term (current) drug therapy: Secondary | ICD-10-CM

## 2010-05-19 DIAGNOSIS — E782 Mixed hyperlipidemia: Secondary | ICD-10-CM

## 2010-05-19 LAB — HEPATIC FUNCTION PANEL
Albumin: 3.1 g/dL — ABNORMAL LOW (ref 3.5–5.2)
Alkaline Phosphatase: 55 U/L (ref 39–117)
Total Protein: 6.3 g/dL (ref 6.0–8.3)

## 2010-05-19 LAB — LIPID PANEL
Cholesterol: 186 mg/dL (ref 0–200)
HDL: 52.2 mg/dL (ref >39.00)
LDL Cholesterol: 120 mg/dL — ABNORMAL HIGH (ref 0–99)
Total CHOL/HDL Ratio: 4
Triglycerides: 68 mg/dL (ref 0.0–149.0)
VLDL: 13.6 mg/dL (ref 0.0–40.0)

## 2010-05-21 ENCOUNTER — Telehealth: Payer: Self-pay | Admitting: *Deleted

## 2010-05-21 DIAGNOSIS — E785 Hyperlipidemia, unspecified: Secondary | ICD-10-CM

## 2010-05-21 MED ORDER — ATORVASTATIN CALCIUM 40 MG PO TABS: 40.0000 mg | ORAL_TABLET | Freq: Every day | ORAL | Status: DC

## 2010-05-21 NOTE — Progress Notes (Signed)
Pt given results of lipid and liver profiles with instructions to change medication from simvastatin to Lipitor 40 mg a day.  Pt states understanding, a RX will be send into [facility] at the pt's request.  She also states she will be having a colonoscopy and possible knee surgery soon and wanted to know if she is healthy enough to have those procedures done.  Instructed pt since she is not having any chest pain or pressure or s/s at this time that she should be cleared for procedures however Dr Antoine Poche will make that decision once the procedures have been scheduled.  She again stated understanding and thanked me for my time.

## 2010-05-21 NOTE — Telephone Encounter (Signed)
Message copied by Rocco Serene on Thu May 21, 2010  8:57 AM ------      Message from: Rollene Rotunda      Created: Tue May 19, 2010  8:46 PM       She needs to take 40 mg of lipitor as she is not at [facility] yet.

## 2010-06-04 ENCOUNTER — Encounter: Payer: Self-pay | Admitting: Cardiology

## 2010-06-05 ENCOUNTER — Encounter: Payer: Self-pay | Admitting: Cardiology

## 2010-06-05 ENCOUNTER — Ambulatory Visit (INDEPENDENT_AMBULATORY_CARE_PROVIDER_SITE_OTHER): Payer: PRIVATE HEALTH INSURANCE | Admitting: Cardiology

## 2010-06-05 VITALS — BP 138/80 | HR 66 | Ht 69.0 in | Wt 209.0 lb

## 2010-06-05 DIAGNOSIS — I10 Essential (primary) hypertension: Secondary | ICD-10-CM | POA: Insufficient documentation

## 2010-06-05 DIAGNOSIS — I2510 Atherosclerotic heart disease of native coronary artery without angina pectoris: Secondary | ICD-10-CM

## 2010-06-05 DIAGNOSIS — E7800 Pure hypercholesterolemia, unspecified: Secondary | ICD-10-CM

## 2010-06-05 DIAGNOSIS — I4891 Unspecified atrial fibrillation: Secondary | ICD-10-CM

## 2010-06-05 NOTE — Assessment & Plan Note (Signed)
I will have her keep a blood pressure diary as this is an unusual finding today. If her blood pressure is still high I will titrate her medications. Of note I told her she could start taking her Lasix and potassium p.r.n. swelling.

## 2010-06-05 NOTE — Assessment & Plan Note (Signed)
No further testing is indicated. She will continue with risk reduction.

## 2010-06-05 NOTE — Patient Instructions (Signed)
Please keep a blood pressure dairy Follow up with Dr Antoine Poche in 3 months Stop lipitor for 2 weeks to see if muscle aches improve Take Lasix and Potassium Chloride as needed

## 2010-06-05 NOTE — Progress Notes (Signed)
HPI Presents for evaluation status post bypass. She reports that since being switched to Lipitor which I did because her LDL is still 124 she has had increasing leg pain and joint pain. She's always had some problems with her knees but thinks that this is related. This is decreasing her quality of life and she's not been able to exercise as much. She's not having any chest pressure, neck or arm discomfort. She has had no palpitations, presyncope or syncope. She has had no PND or orthopnea. She has had no weight gain or edema. She completed cardiac rehabilitation.  Allergies  Allergen Reactions  . Naproxen Sodium     Current Outpatient Prescriptions  Medication Sig Dispense Refill  . acetaminophen (TYLENOL) 325 MG tablet Take 650 mg by mouth every 6 (six) hours as needed.        Marland Kitchen aspirin 81 MG tablet Take 81 mg by mouth daily.        Marland Kitchen atorvastatin (LIPITOR) 40 MG tablet Take 1 tablet (40 mg total) by mouth daily.  30 tablet  11  . Calcium Magnesium 500-100-500 LIQD Take by mouth.        . Cholecalciferol (VITAMIN D3) 2000 UNITS TABS Take 1 capsule by mouth daily.        . Coenzyme Q10 (CO Q 10) 100 MG CAPS Take by mouth 2 (two) times daily.        Marland Kitchen docusate sodium (COLACE) 100 MG capsule Take 100 mg by mouth 2 (two) times daily.        . folic acid (FOLVITE) 800 MCG tablet Take 400 mcg by mouth daily.        . furosemide (LASIX) 20 MG tablet Take 20 mg by mouth daily.        Marland Kitchen glucosamine-chondroitin 500-400 MG tablet Take 1 tablet by mouth daily.        Marland Kitchen leflunomide (ARAVA) 10 MG tablet Take 10 mg by mouth. 1 1/2 tablet       . metoprolol tartrate (LOPRESSOR) 25 MG tablet Take 12.5 mg by mouth 2 (two) times daily.        . nitroGLYCERIN (NITROSTAT) 0.4 MG SL tablet Place 0.4 mg under the tongue every 5 (five) minutes as needed.        . NON FORMULARY ELEVIV -- 350 mg  2 in the morning       . Omega-3 Fatty Acids (FISH OIL) 1000 MG CAPS Take 3 capsules by mouth daily.        . potassium  chloride (KLOR-CON) 10 MEQ CR tablet Take 10 mEq by mouth daily.        . predniSONE (DELTASONE) 10 MG tablet Take 10 mg by mouth daily.       . vitamin B-12 (CYANOCOBALAMIN) 1000 MCG tablet Take 1,000 mcg by mouth daily. With folic acid 400 mcg       . Fluticasone-Salmeterol (ADVAIR DISKUS) 250-50 MCG/DOSE AEPB Inhale 1 puff into the lungs every 12 (twelve) hours.        Marland Kitchen DISCONTD: simvastatin (ZOCOR) 40 MG tablet Take 40 mg by mouth at bedtime.          Past Medical History  Diagnosis Date  . Hyperlipidemia   . Coronary artery disease     s/p NSTEMI 09/2009; s/p CABG 09/2009 [facility], [place]);echo 12/12/2009: EF 55-60%, normal RVF  . Pleural effusion     post CABG  . COPD (chronic obstructive pulmonary disease)   . RA (rheumatoid arthritis)   .  Pulmonary nodules     Past Surgical History  Procedure Date  . C-sections     x2  . Arthroscopic knee surgery   . Stapedectomy   . Coronary artery bypass graft     LIMA to the LAD, SVG right coronary artery, SVG to obtuse marginal September 2011     ROS:  As stated in the HPI and negative for all other systems.  PHYSICAL EXAM BP 172/93  Pulse 66  Ht 5\' 9"  (1.753 m)  Wt 209 lb (94.802 kg)  BMI 30.86 kg/m2 GENERAL:  Well appearing HEENT:  Pupils equal round and reactive, fundi not visualized, oral mucosa unremarkable NECK:  No jugular venous distention, waveform within normal limits, carotid upstroke brisk and symmetric, no bruits, no thyromegaly LYMPHATICS:  No cervical, inguinal adenopathy LUNGS:  Clear to auscultation bilaterally BACK:  No CVA tenderness CHEST:  Well healed sternotomy scar. HEART:  PMI not displaced or sustained,S1 and S2 within normal limits, no S3, no S4, no clicks, no rubs, no murmurs ABD:  Flat, positive bowel sounds normal in frequency in pitch, no bruits, no rebound, no guarding, no midline pulsatile mass, no hepatomegaly, no splenomegaly EXT:  2 plus pulses throughout, no edema, no cyanosis no  clubbing SKIN:  No rashes no nodules NEURO:  Cranial nerves II through XII grossly intact, motor grossly intact throughout PSYCH:  Cognitively intact, oriented to person place and time   EKG:  Sinus rhythm, rate 66, axis within normal limits, intervals within normal limits, no acute ST-T wave changes.   ASSESSMENT AND PLAN

## 2010-06-05 NOTE — Assessment & Plan Note (Signed)
I will have her hold her Lipitor for 2 weeks to see if this is causing the muscle aches. If her pain goes away she will restart to see if the symptoms recur. If that is the case she will need to be switched to Crestor.

## 2010-06-12 NOTE — Discharge Summary (Signed)
   NAME:  Marilyn Hernandez, Marilyn Hernandez                         ACCOUNT NO.:  [id number]   MEDICAL RECORD NO.:  [id number]                   PATIENT TYPE:  INP   LOCATION:  0471                                 FACILITY:  [facility]   PHYSICIAN:  Ollen Gross. Vernell Morgans, M.D.              DATE OF BIRTH:  [birth year]   DATE OF ADMISSION:  11/02/2001  DATE OF DISCHARGE:  11/10/2001                                 DISCHARGE SUMMARY   HISTORY OF PRESENT ILLNESS:  The patient is a 59 year old white female who  developed lower abdominal pain with some nausea.  She was running a slight  low-grade fever.  She came to the emergency department where she underwent a  CT scan that was significant for sigmoid diverticulitis.   HOSPITAL COURSE:  She was started on Cipro. Flagyl, and bowel rest.  On  hospital day #6, she began to experience some improvement in her pain to the  point where she was started on clear liquids.  She never had a fever during  this period of time.  She gradually continued to improve, and was eventually  switched to oral Cipro and Flagyl, and by 11/10/01, she was tolerating a low  residue diet and plans were made for her to be discharged home with close  followup.   ACTIVITY:  As tolerated.   DIET:  Low residue diet.   DISCHARGE MEDICATIONS:  1. She is to resume her home medications.  2. Vicodin for pain.  3. Phenergan for pain.  4. Cipro antibiotic.  5. Flagyl antibiotic.   FINAL DIAGNOSIS:  Sigmoid diverticulitis.   FOLLOWUP:  She will follow up in the next two weeks with Dr. Carolynne Edouard.   CONDITION ON DISCHARGE:  She is discharged home in stable condition.                                                Ollen Gross. Vernell Morgans, M.D.    PST/MEDQ  D:  11/27/2001  T:  11/27/2001  Job:  [id number]

## 2010-06-12 NOTE — Procedures (Signed)
[facility]  Patient:    Marilyn Hernandez, Marilyn Hernandez                      MRN: [id number] Proc. Date: 06/25/99 Adm. Date:  [id number] Disc. Date: [id number] Attending:  Mingo Amber CC:         Zenaida Niece, M.D.                           Procedure Report  PROCEDURE:  Video colonoscopy.  ENDOSCOPIST:  Roosvelt Harps, M.D.  INDICATIONS:  A 60 year old female with family history of colon cancer in her father.  PREPARATION:  She is n.p.o. since midnight, having taken Phospho-Soda prep and clear liquid diet yesterday.  The mucosa is clean throughout, depth of insertion, cecum.  PREPROCEDURE SEDATION:  Prior to and in the initial stages of the procedure, she received a total of 60 mg of Demerol and 6.5 mg of Versed intravenously. In addition, she was on 2 liters of nasal cannula O2.  DESCRIPTION OF PROCEDURE:  The Olympus video colonoscope was inserted via the rectum and advanced quite easily all the way to the cecum.  Cecal landmarks were identified and photographed.  On withdrawal, the mucosa was carefully evaluated.  The entire right colon was normal.  Within the left colon was mild diverticulosis.  Retroflexed view of the rectum was unremarkable.  No areas of polyposis were seen.  No biopsies were taken.  The patient tolerated the procedure well.  Pulse, blood pressure, and oximetry testing were stable throughout.  She was observed in recovery for 45 minutes and discharged home, alert, with a benign abdomen.  IMPRESSION:  Mild left-sided diverticulosis, otherwise normal colonoscopy.  RECOMMENDATIONS:  In light of her family history, consideration should be given to a repeat colonoscopy in 5-10 years. DD:  06/25/99 TD:  06/29/99 Job: [id number]

## 2010-06-26 ENCOUNTER — Telehealth: Payer: Self-pay | Admitting: Cardiology

## 2010-06-26 DIAGNOSIS — E782 Mixed hyperlipidemia: Secondary | ICD-10-CM

## 2010-06-26 NOTE — Telephone Encounter (Signed)
Spoke with pt who has been off of her Lipitor for 2 weeks now and reports feeling so much better.  She states "I can move and I can sleep now - I just feel like a whole new person".  She had been instructed to re-start to see if symptoms returned however she would prefer not to re-start Lipitor, if fact she would like to try to stay away from all statins.  Instructed pt to remain off Lipitor thru the weekend and I will discuss with Dr Antoine Poche and call her back next week with recommendations.

## 2010-06-26 NOTE — Telephone Encounter (Signed)
Cell num [id number]  Been off Lipitor for 2 weeks and she was suppose to call and let you know how she is doing.  Doing much better.  Please call her back and let her know if she continue to stay off or start on something else.

## 2010-06-28 NOTE — Telephone Encounter (Signed)
With her LDL and CAD I would prefer that she be on a statin (Crestor 20 mg at least).  However, it is always the patient's choice.

## 2010-06-29 MED ORDER — ROSUVASTATIN CALCIUM 20 MG PO TABS: 20.0000 mg | ORAL_TABLET | Freq: Every day | ORAL | Status: DC

## 2010-06-29 NOTE — Telephone Encounter (Signed)
Left message for pt to call back  °

## 2010-06-29 NOTE — Telephone Encounter (Signed)
Pt aware of recommendation and will try the Crestor.  Rx will be sent into [facility] at pts request.  Pt also states that she had been feeling a little bit bloated and has taken some Furosemide and potassium chloride.  She is feeling some what better with this.  She will call back if bloating returns for if she has problems with the Crestor.

## 2010-09-11 ENCOUNTER — Encounter: Payer: Self-pay | Admitting: Cardiology

## 2010-09-11 ENCOUNTER — Ambulatory Visit (INDEPENDENT_AMBULATORY_CARE_PROVIDER_SITE_OTHER): Payer: PRIVATE HEALTH INSURANCE | Admitting: Cardiology

## 2010-09-11 DIAGNOSIS — E7800 Pure hypercholesterolemia, unspecified: Secondary | ICD-10-CM

## 2010-09-11 DIAGNOSIS — I10 Essential (primary) hypertension: Secondary | ICD-10-CM

## 2010-09-11 DIAGNOSIS — Z951 Presence of aortocoronary bypass graft: Secondary | ICD-10-CM

## 2010-09-11 DIAGNOSIS — I4891 Unspecified atrial fibrillation: Secondary | ICD-10-CM

## 2010-09-11 LAB — HEPATIC FUNCTION PANEL
Albumin: 3.9 g/dL (ref 3.5–5.2)
Alkaline Phosphatase: 68 U/L (ref 39–117)
Bilirubin, Direct: 0 mg/dL (ref 0.0–0.3)
Total Bilirubin: 0.5 mg/dL (ref 0.3–1.2)

## 2010-09-11 MED ORDER — NITROGLYCERIN 0.4 MG SL SUBL: 0.4000 mg | SUBLINGUAL_TABLET | SUBLINGUAL | Status: DC | PRN

## 2010-09-11 NOTE — Assessment & Plan Note (Signed)
The patient has no new sypmtoms.  No further cardiovascular testing is indicated.  We will continue with aggressive risk reduction and meds as listed.  

## 2010-09-11 NOTE — Assessment & Plan Note (Signed)
The blood pressure is at target. No change in medications is indicated. We will continue with therapeutic lifestyle changes (TLC).  

## 2010-09-11 NOTE — Patient Instructions (Signed)
Please have lab work today (lipid and liver)  The current medical regimen is effective;  continue present plan and medications.  Follow up in 1 year with Dr Antoine Poche.  You will receive a letter in the mail 2 months before you are due.  Please call us when you receive this letter to schedule your follow up appointment.

## 2010-09-11 NOTE — Assessment & Plan Note (Signed)
She didn't tolerate Lipitor.  She is on Crestor and I switched her to Crestor.  I will check a lipid profile today.

## 2010-09-11 NOTE — Progress Notes (Signed)
HPI [name] presents for evaluation status post bypass.  She is doing well.  The patient denies any new symptoms such as chest discomfort, neck or arm discomfort. There has been no new shortness of breath, PND or orthopnea. There have been no reported palpitations, presyncope or syncope.  She is walking routinely.  She denies any of her previous angina symptoms.  She rarely takes Lasix as she has no edema.  Allergies  Allergen Reactions  . Naproxen Sodium     Current Outpatient Prescriptions  Medication Sig Dispense Refill  . acetaminophen (TYLENOL) 325 MG tablet Take 650 mg by mouth every 6 (six) hours as needed.        Marland Kitchen aspirin 81 MG tablet Take 81 mg by mouth daily.        . Calcium Magnesium 500-100-500 LIQD Take by mouth.        . Cholecalciferol (VITAMIN D3) 2000 UNITS TABS Take 1 capsule by mouth daily.        . Coenzyme Q10 (CO Q 10) 100 MG CAPS Take by mouth 2 (two) times daily.        Marland Kitchen docusate sodium (COLACE) 100 MG capsule Take 100 mg by mouth 2 (two) times daily.        . folic acid (FOLVITE) 800 MCG tablet Take 400 mcg by mouth daily.        Marland Kitchen glucosamine-chondroitin 500-400 MG tablet Take 1 tablet by mouth daily.        Marland Kitchen leflunomide (ARAVA) 10 MG tablet Take 20 mg by mouth.       . metoprolol tartrate (LOPRESSOR) 25 MG tablet Take 12.5 mg by mouth 2 (two) times daily.        . nitroGLYCERIN (NITROSTAT) 0.4 MG SL tablet Place 0.4 mg under the tongue every 5 (five) minutes as needed.        . NON FORMULARY ELEVIV -- 350 mg  2 in the morning       . Omega-3 Fatty Acids (FISH OIL) 1000 MG CAPS Take 3 capsules by mouth daily.        . predniSONE (DELTASONE) 10 MG tablet Take 5 mg by mouth daily.       . rosuvastatin (CRESTOR) 20 MG tablet Take 1 tablet (20 mg total) by mouth daily.  30 tablet  11  . vitamin B-12 (CYANOCOBALAMIN) 1000 MCG tablet Take 1,000 mcg by mouth daily. With folic acid 400 mcg       . Fluticasone-Salmeterol (ADVAIR DISKUS) 250-50 MCG/DOSE AEPB  Inhale 1 puff into the lungs every 12 (twelve) hours as needed.       . furosemide (LASIX) 20 MG tablet Take 20 mg by mouth as needed.       . potassium chloride (KLOR-CON) 10 MEQ CR tablet Take 10 mEq by mouth as needed.         Past Medical History  Diagnosis Date  . Hyperlipidemia   . Coronary artery disease     s/p NSTEMI 09/2009; s/p CABG 09/2009 [facility], [place]);echo 12/12/2009: EF 55-60%, normal RVF  . Pleural effusion     post CABG  . COPD (chronic obstructive pulmonary disease)   . RA (rheumatoid arthritis)   . Pulmonary nodules     Past Surgical History  Procedure Date  . C-sections     x2  . Arthroscopic knee surgery   . Stapedectomy   . Coronary artery bypass graft     LIMA to the LAD, SVG right  coronary artery, SVG to obtuse marginal September 2011     ROS:  As stated in the HPI and negative for all other systems.  PHYSICAL EXAM BP 142/90  Pulse 87  Ht 5\' 10"  (1.778 m)  Wt 209 lb (94.802 kg)  BMI 29.99 kg/m2 GENERAL:  Well appearing HEENT:  Pupils equal round and reactive, fundi not visualized, oral mucosa unremarkable NECK:  No jugular venous distention, waveform within normal limits, carotid upstroke brisk and symmetric, no bruits, no thyromegaly LYMPHATICS:  No cervical, inguinal adenopathy LUNGS:  Clear to auscultation bilaterally BACK:  No CVA tenderness CHEST:  Well healed sternotomy scar. HEART:  PMI not displaced or sustained,S1 and S2 within normal limits, no S3, no S4, no clicks, no rubs, no murmurs ABD:  Flat, positive bowel sounds normal in frequency in pitch, no bruits, no rebound, no guarding, no midline pulsatile mass, no hepatomegaly, no splenomegaly EXT:  2 plus pulses throughout, no edema, no cyanosis no clubbing SKIN:  No rashes no nodules NEURO:  Cranial nerves II through XII grossly intact, motor grossly intact throughout PSYCH:  Cognitively intact, oriented to person place and time  ASSESSMENT AND PLAN

## 2010-09-17 LAB — LIPID PANEL
HDL: 49.4 mg/dL (ref >39.00)
LDL Cholesterol: 67 mg/dL (ref 0–99)
VLDL: 13 mg/dL (ref 0.0–40.0)

## 2010-10-22 ENCOUNTER — Other Ambulatory Visit: Payer: Self-pay | Admitting: *Deleted

## 2010-10-22 MED ORDER — FUROSEMIDE 20 MG PO TABS: 20.0000 mg | ORAL_TABLET | ORAL | Status: DC | PRN

## 2010-10-29 ENCOUNTER — Other Ambulatory Visit: Payer: Self-pay

## 2010-10-29 MED ORDER — METOPROLOL TARTRATE 25 MG PO TABS: 12.5000 mg | ORAL_TABLET | Freq: Two times a day (BID) | ORAL | Status: DC

## 2011-03-18 ENCOUNTER — Other Ambulatory Visit: Payer: Self-pay | Admitting: Family Medicine

## 2011-03-18 DIAGNOSIS — Z1231 Encounter for screening mammogram for malignant neoplasm of breast: Secondary | ICD-10-CM

## 2011-03-26 ENCOUNTER — Ambulatory Visit
Admission: RE | Admit: 2011-03-26 | Discharge: 2011-03-26 | Disposition: A | Discharge status: Home / Self Care | Payer: PRIVATE HEALTH INSURANCE | Source: Ambulatory Visit | Attending: Family Medicine | Admitting: Family Medicine

## 2011-03-26 DIAGNOSIS — Z1231 Encounter for screening mammogram for malignant neoplasm of breast: Secondary | ICD-10-CM

## 2011-09-07 ENCOUNTER — Other Ambulatory Visit: Payer: Self-pay | Admitting: Cardiology

## 2011-09-07 NOTE — Telephone Encounter (Signed)
..   Requested Prescriptions   Pending Prescriptions Disp Refills  . potassium chloride (K-DUR) 10 MEQ tablet [Pharmacy Med Name: POTASSIUM CL ER 10 MEQ TABLET] 30 tablet 1    Sig: TAKE ONE TABLET DAILY WITH FUROSEMIDE OR AS OTHERWISE DIRECTED BY YOUR PHYSICIAN

## 2011-09-16 ENCOUNTER — Ambulatory Visit: Payer: PRIVATE HEALTH INSURANCE | Admitting: Cardiology

## 2011-09-16 ENCOUNTER — Emergency Department (HOSPITAL_COMMUNITY): Payer: BC Managed Care – PPO

## 2011-09-16 ENCOUNTER — Observation Stay (HOSPITAL_COMMUNITY)
Admission: EM | Admit: 2011-09-16 | Discharge: 2011-09-17 | Disposition: A | Discharge status: Home / Self Care | Payer: BC Managed Care – PPO | Attending: General Surgery | Admitting: General Surgery

## 2011-09-16 ENCOUNTER — Encounter (HOSPITAL_COMMUNITY): Payer: Self-pay | Admitting: Critical Care Medicine

## 2011-09-16 ENCOUNTER — Encounter (HOSPITAL_COMMUNITY)
Admission: EM | Disposition: A | Discharge status: Home / Self Care | Payer: Self-pay | Source: Home / Self Care | Attending: Emergency Medicine

## 2011-09-16 ENCOUNTER — Emergency Department (HOSPITAL_COMMUNITY): Payer: BC Managed Care – PPO | Admitting: Critical Care Medicine

## 2011-09-16 ENCOUNTER — Encounter (HOSPITAL_COMMUNITY): Payer: Self-pay | Admitting: Emergency Medicine

## 2011-09-16 DIAGNOSIS — I2510 Atherosclerotic heart disease of native coronary artery without angina pectoris: Secondary | ICD-10-CM | POA: Insufficient documentation

## 2011-09-16 DIAGNOSIS — M069 Rheumatoid arthritis, unspecified: Secondary | ICD-10-CM | POA: Insufficient documentation

## 2011-09-16 DIAGNOSIS — Z79899 Other long term (current) drug therapy: Secondary | ICD-10-CM | POA: Insufficient documentation

## 2011-09-16 DIAGNOSIS — K3580 Unspecified acute appendicitis: Secondary | ICD-10-CM

## 2011-09-16 DIAGNOSIS — J4489 Other specified chronic obstructive pulmonary disease: Secondary | ICD-10-CM | POA: Insufficient documentation

## 2011-09-16 DIAGNOSIS — I10 Essential (primary) hypertension: Secondary | ICD-10-CM | POA: Insufficient documentation

## 2011-09-16 DIAGNOSIS — IMO0002 Reserved for concepts with insufficient information to code with codable children: Secondary | ICD-10-CM | POA: Insufficient documentation

## 2011-09-16 DIAGNOSIS — E782 Mixed hyperlipidemia: Secondary | ICD-10-CM

## 2011-09-16 DIAGNOSIS — K37 Unspecified appendicitis: Secondary | ICD-10-CM

## 2011-09-16 DIAGNOSIS — E785 Hyperlipidemia, unspecified: Secondary | ICD-10-CM | POA: Insufficient documentation

## 2011-09-16 DIAGNOSIS — R918 Other nonspecific abnormal finding of lung field: Secondary | ICD-10-CM | POA: Insufficient documentation

## 2011-09-16 DIAGNOSIS — Z951 Presence of aortocoronary bypass graft: Secondary | ICD-10-CM

## 2011-09-16 DIAGNOSIS — J449 Chronic obstructive pulmonary disease, unspecified: Secondary | ICD-10-CM | POA: Insufficient documentation

## 2011-09-16 DIAGNOSIS — I252 Old myocardial infarction: Secondary | ICD-10-CM | POA: Insufficient documentation

## 2011-09-16 HISTORY — PX: LAPAROSCOPIC APPENDECTOMY: SHX408

## 2011-09-16 LAB — HEPATIC FUNCTION PANEL
ALT: 16 U/L (ref 0–35)
AST: 22 U/L (ref 0–37)
Albumin: 3.7 g/dL (ref 3.5–5.2)
Alkaline Phosphatase: 72 U/L (ref 39–117)
Bilirubin, Direct: 0.2 mg/dL (ref 0.0–0.3)
Indirect Bilirubin: 0.4 mg/dL (ref 0.3–0.9)
Total Bilirubin: 0.6 mg/dL (ref 0.3–1.2)
Total Protein: 7.2 g/dL (ref 6.0–8.3)

## 2011-09-16 LAB — CBC WITH DIFFERENTIAL/PLATELET
Basophils Absolute: 0 10*3/uL (ref 0.0–0.1)
Basophils Relative: 0 % (ref 0–1)
Eosinophils Absolute: 0.1 K/uL (ref 0.0–0.7)
Eosinophils Relative: 1 % (ref 0–5)
HCT: 41.4 % (ref 36.0–46.0)
Hemoglobin: 13.8 g/dL (ref 12.0–15.0)
Lymphocytes Relative: 6 % — ABNORMAL LOW (ref 12–46)
Lymphs Abs: 0.9 K/uL (ref 0.7–4.0)
MCH: 30.1 pg (ref 26.0–34.0)
MCHC: 33.3 g/dL (ref 30.0–36.0)
MCV: 90.2 fL (ref 78.0–100.0)
Monocytes Absolute: 1.5 10*3/uL — ABNORMAL HIGH (ref 0.1–1.0)
Monocytes Relative: 11 % (ref 3–12)
Neutro Abs: 11.9 10*3/uL — ABNORMAL HIGH (ref 1.7–7.7)
Neutrophils Relative %: 82 % — ABNORMAL HIGH (ref 43–77)
Platelets: 218 K/uL (ref 150–400)
RBC: 4.59 MIL/uL (ref 3.87–5.11)
RDW: 13 % (ref 11.5–15.5)
WBC: 14.5 10*3/uL — ABNORMAL HIGH (ref 4.0–10.5)

## 2011-09-16 LAB — BASIC METABOLIC PANEL
BUN: 12 mg/dL (ref 6–23)
CO2: 25 mEq/L (ref 19–32)
Chloride: 107 mEq/L (ref 96–112)
Creatinine, Ser: 0.88 mg/dL (ref 0.50–1.10)
GFR calc Af Amer: 80 mL/min — ABNORMAL LOW (ref >90)
Potassium: 3.7 mEq/L (ref 3.5–5.1)

## 2011-09-16 LAB — BASIC METABOLIC PANEL WITH GFR
Calcium: 9.2 mg/dL (ref 8.4–10.5)
GFR calc non Af Amer: 69 mL/min — ABNORMAL LOW (ref >90)
Glucose, Bld: 104 mg/dL — ABNORMAL HIGH (ref 70–99)
Sodium: 142 meq/L (ref 135–145)

## 2011-09-16 LAB — LIPASE, BLOOD: Lipase: 22 U/L (ref 11–59)

## 2011-09-16 SURGERY — APPENDECTOMY, LAPAROSCOPIC
Anesthesia: General | Site: Abdomen | Wound class: Contaminated

## 2011-09-16 MED ORDER — NITROGLYCERIN 0.4 MG SL SUBL: 0.4000 mg | SUBLINGUAL_TABLET | SUBLINGUAL | Status: DC | PRN

## 2011-09-16 MED ORDER — ONDANSETRON HCL 4 MG PO TABS: 4.0000 mg | ORAL_TABLET | Freq: Four times a day (QID) | ORAL | Status: DC | PRN

## 2011-09-16 MED ORDER — METOPROLOL TARTRATE 12.5 MG HALF TABLET
12.5000 mg | ORAL_TABLET | Freq: Two times a day (BID) | ORAL | Status: DC
  Administered 2011-09-16 – 2011-09-17 (×2): 12.5 mg via ORAL
  Filled 2011-09-16 (×3): qty 1

## 2011-09-16 MED ORDER — ONDANSETRON HCL 4 MG/2ML IJ SOLN
INTRAMUSCULAR | Status: DC | PRN
  Administered 2011-09-16: 4 mg via INTRAVENOUS

## 2011-09-16 MED ORDER — MORPHINE SULFATE 4 MG/ML IJ SOLN
8.0000 mg | Freq: Once | INTRAMUSCULAR | Status: AC
  Administered 2011-09-16: 8 mg via INTRAVENOUS
  Filled 2011-09-16: qty 2

## 2011-09-16 MED ORDER — MORPHINE SULFATE 2 MG/ML IJ SOLN
2.0000 mg | INTRAMUSCULAR | Status: DC | PRN
  Administered 2011-09-16 (×2): 2 mg via INTRAVENOUS
  Filled 2011-09-16 (×2): qty 1

## 2011-09-16 MED ORDER — POTASSIUM CHLORIDE ER 10 MEQ PO TBCR
10.0000 meq | EXTENDED_RELEASE_TABLET | Freq: Every day | ORAL | Status: DC
  Administered 2011-09-17: 10 meq via ORAL
  Filled 2011-09-16 (×2): qty 1

## 2011-09-16 MED ORDER — ONDANSETRON HCL 4 MG/2ML IJ SOLN: 4.0000 mg | Freq: Once | INTRAMUSCULAR | Status: DC | PRN

## 2011-09-16 MED ORDER — DROPERIDOL 2.5 MG/ML IJ SOLN
0.6250 mg | Freq: Once | INTRAMUSCULAR | Status: AC
  Administered 2011-09-16: 0.625 mg via INTRAVENOUS

## 2011-09-16 MED ORDER — PREDNISONE 5 MG PO TABS
5.0000 mg | ORAL_TABLET | Freq: Every day | ORAL | Status: DC
  Administered 2011-09-17: 5 mg via ORAL
  Filled 2011-09-16 (×3): qty 1

## 2011-09-16 MED ORDER — ROCURONIUM BROMIDE 100 MG/10ML IV SOLN
INTRAVENOUS | Status: DC | PRN
  Administered 2011-09-16: 30 mg via INTRAVENOUS

## 2011-09-16 MED ORDER — PROPOFOL 10 MG/ML IV EMUL
INTRAVENOUS | Status: DC | PRN
  Administered 2011-09-16: 130 mg via INTRAVENOUS

## 2011-09-16 MED ORDER — OXYCODONE-ACETAMINOPHEN 5-325 MG PO TABS
1.0000 | ORAL_TABLET | ORAL | Status: DC | PRN
  Administered 2011-09-17 (×2): 2 via ORAL
  Filled 2011-09-16 (×2): qty 2

## 2011-09-16 MED ORDER — MIDAZOLAM HCL 5 MG/5ML IJ SOLN
INTRAMUSCULAR | Status: DC | PRN
  Administered 2011-09-16: 2 mg via INTRAVENOUS

## 2011-09-16 MED ORDER — KCL IN DEXTROSE-NACL 20-5-0.45 MEQ/L-%-% IV SOLN
INTRAVENOUS | Status: DC
  Administered 2011-09-16: 19:00:00 via INTRAVENOUS
  Filled 2011-09-16 (×4): qty 1000

## 2011-09-16 MED ORDER — IOHEXOL 300 MG/ML  SOLN
20.0000 mL | INTRAMUSCULAR | Status: AC
  Administered 2011-09-16 (×2): 20 mL via ORAL

## 2011-09-16 MED ORDER — BUPIVACAINE-EPINEPHRINE 0.25% -1:200000 IJ SOLN
INTRAMUSCULAR | Status: DC | PRN
  Administered 2011-09-16: 16 mL

## 2011-09-16 MED ORDER — PHENYLEPHRINE HCL 10 MG/ML IJ SOLN
INTRAMUSCULAR | Status: DC | PRN
  Administered 2011-09-16: 80 ug via INTRAVENOUS
  Administered 2011-09-16: 120 ug via INTRAVENOUS
  Administered 2011-09-16: 80 ug via INTRAVENOUS

## 2011-09-16 MED ORDER — FENTANYL CITRATE 0.05 MG/ML IJ SOLN
INTRAMUSCULAR | Status: DC | PRN
  Administered 2011-09-16: 50 ug via INTRAVENOUS
  Administered 2011-09-16: 150 ug via INTRAVENOUS
  Administered 2011-09-16: 50 ug via INTRAVENOUS

## 2011-09-16 MED ORDER — FUROSEMIDE 20 MG PO TABS
20.0000 mg | ORAL_TABLET | Freq: Every day | ORAL | Status: DC
Start: 2011-09-16 — End: 2011-09-17
  Administered 2011-09-17: 20 mg via ORAL
  Filled 2011-09-16 (×2): qty 1

## 2011-09-16 MED ORDER — IOHEXOL 300 MG/ML  SOLN
100.0000 mL | Freq: Once | INTRAMUSCULAR | Status: AC | PRN
  Administered 2011-09-16: 100 mL via INTRAVENOUS

## 2011-09-16 MED ORDER — SODIUM CHLORIDE 0.9 % IR SOLN
Status: DC | PRN
  Administered 2011-09-16 (×2): 1000 mL

## 2011-09-16 MED ORDER — HEPARIN SODIUM (PORCINE) 5000 UNIT/ML IJ SOLN
5000.0000 [IU] | Freq: Three times a day (TID) | INTRAMUSCULAR | Status: DC
  Administered 2011-09-17 (×2): 5000 [IU] via SUBCUTANEOUS
  Filled 2011-09-16 (×4): qty 1

## 2011-09-16 MED ORDER — MOXIFLOXACIN HCL IN NACL 400 MG/250ML IV SOLN: 400.0000 mg | Freq: Once | INTRAVENOUS | Status: DC

## 2011-09-16 MED ORDER — GLYCOPYRROLATE 0.2 MG/ML IJ SOLN
INTRAMUSCULAR | Status: DC | PRN
  Administered 2011-09-16: .5 mg via INTRAVENOUS

## 2011-09-16 MED ORDER — 0.9 % SODIUM CHLORIDE (POUR BTL) OPTIME
TOPICAL | Status: DC | PRN
  Administered 2011-09-16: 1000 mL

## 2011-09-16 MED ORDER — FLUTICASONE-SALMETEROL 250-50 MCG/DOSE IN AEPB
1.0000 | INHALATION_SPRAY | Freq: Two times a day (BID) | RESPIRATORY_TRACT | Status: DC | PRN
  Filled 2011-09-16: qty 14

## 2011-09-16 MED ORDER — BUPIVACAINE-EPINEPHRINE PF 0.25-1:200000 % IJ SOLN
INTRAMUSCULAR | Status: AC
  Filled 2011-09-16: qty 30

## 2011-09-16 MED ORDER — SUCCINYLCHOLINE CHLORIDE 20 MG/ML IJ SOLN
INTRAMUSCULAR | Status: DC | PRN
  Administered 2011-09-16: 100 mg via INTRAVENOUS

## 2011-09-16 MED ORDER — HYDROMORPHONE HCL PF 1 MG/ML IJ SOLN: 0.2500 mg | INTRAMUSCULAR | Status: DC | PRN

## 2011-09-16 MED ORDER — PREDNISONE 5 MG PO TABS
5.0000 mg | ORAL_TABLET | Freq: Every day | ORAL | Status: DC
  Filled 2011-09-16: qty 1

## 2011-09-16 MED ORDER — LIDOCAINE HCL (CARDIAC) 20 MG/ML IV SOLN
INTRAVENOUS | Status: DC | PRN
  Administered 2011-09-16: 20 mg via INTRAVENOUS

## 2011-09-16 MED ORDER — LACTATED RINGERS IV SOLN
INTRAVENOUS | Status: DC | PRN
  Administered 2011-09-16 (×2): via INTRAVENOUS

## 2011-09-16 MED ORDER — ONDANSETRON HCL 4 MG/2ML IJ SOLN
4.0000 mg | Freq: Four times a day (QID) | INTRAMUSCULAR | Status: DC | PRN
  Administered 2011-09-16 – 2011-09-17 (×2): 4 mg via INTRAVENOUS
  Filled 2011-09-16 (×2): qty 2

## 2011-09-16 MED ORDER — SODIUM CHLORIDE 0.9 % IV SOLN: Freq: Once | INTRAVENOUS | Status: DC

## 2011-09-16 MED ORDER — ACETAMINOPHEN 10 MG/ML IV SOLN
1000.0000 mg | Freq: Once | INTRAVENOUS | Status: AC | PRN
  Administered 2011-09-16: 1000 mg via INTRAVENOUS

## 2011-09-16 MED ORDER — ACETAMINOPHEN 325 MG PO TABS: 650.0000 mg | ORAL_TABLET | ORAL | Status: DC | PRN

## 2011-09-16 MED ORDER — SODIUM CHLORIDE 0.9 % IV SOLN
1.0000 g | Freq: Once | INTRAVENOUS | Status: AC
  Administered 2011-09-16: 1 g via INTRAVENOUS
  Filled 2011-09-16: qty 1

## 2011-09-16 MED ORDER — ONDANSETRON HCL 4 MG/2ML IJ SOLN
4.0000 mg | Freq: Once | INTRAMUSCULAR | Status: AC
  Administered 2011-09-16: 4 mg via INTRAVENOUS
  Filled 2011-09-16: qty 2

## 2011-09-16 MED ORDER — DOCUSATE SODIUM 100 MG PO CAPS
100.0000 mg | ORAL_CAPSULE | Freq: Two times a day (BID) | ORAL | Status: DC
  Administered 2011-09-17: 100 mg via ORAL
  Filled 2011-09-16: qty 1

## 2011-09-16 MED ORDER — LEFLUNOMIDE 10 MG PO TABS: 20.0000 mg | ORAL_TABLET | Freq: Every day | ORAL | Status: DC

## 2011-09-16 MED ORDER — ACETAMINOPHEN 10 MG/ML IV SOLN
INTRAVENOUS | Status: AC
  Filled 2011-09-16: qty 100

## 2011-09-16 MED ORDER — LEFLUNOMIDE 20 MG PO TABS
20.0000 mg | ORAL_TABLET | Freq: Every day | ORAL | Status: DC
  Administered 2011-09-16: 20 mg via ORAL
  Filled 2011-09-16 (×3): qty 1

## 2011-09-16 MED ORDER — SODIUM CHLORIDE 0.9 % IV BOLUS (SEPSIS)
1000.0000 mL | Freq: Once | INTRAVENOUS | Status: AC
  Administered 2011-09-16: 1000 mL via INTRAVENOUS

## 2011-09-16 MED ORDER — NEOSTIGMINE METHYLSULFATE 1 MG/ML IJ SOLN
INTRAMUSCULAR | Status: DC | PRN
  Administered 2011-09-16: 4 mg via INTRAVENOUS

## 2011-09-16 SURGICAL SUPPLY — 52 items
ADH SKN CLS APL DERMABOND .7 (GAUZE/BANDAGES/DRESSINGS) ×1
APPLIER CLIP ROT 10 11.4 M/L (STAPLE)
APR CLP MED LRG 11.4X10 (STAPLE)
BAG SPEC RTRVL LRG 6X4 10 (ENDOMECHANICALS) ×1
BLADE SURG ROTATE 9660 (MISCELLANEOUS) IMPLANT
CANISTER SUCTION 2500CC (MISCELLANEOUS) ×2 IMPLANT
CHLORAPREP W/TINT 26ML (MISCELLANEOUS) ×2 IMPLANT
CLIP APPLIE ROT 10 11.4 M/L (STAPLE) IMPLANT
CLOTH BEACON ORANGE TIMEOUT ST (SAFETY) ×2 IMPLANT
COVER SURGICAL LIGHT HANDLE (MISCELLANEOUS) ×2 IMPLANT
CUTTER LINEAR ENDO 35 ETS (STAPLE) IMPLANT
CUTTER LINEAR ENDO 35 ETS TH (STAPLE) ×1 IMPLANT
DECANTER SPIKE VIAL GLASS SM (MISCELLANEOUS) ×2 IMPLANT
DERMABOND ADVANCED (GAUZE/BANDAGES/DRESSINGS) ×1
DERMABOND ADVANCED .7 DNX12 (GAUZE/BANDAGES/DRESSINGS) ×1 IMPLANT
DRAPE UTILITY 15X26 W/TAPE STR (DRAPE) ×4 IMPLANT
ELECT REM PT RETURN 9FT ADLT (ELECTROSURGICAL) ×2
ELECTRODE REM PT RTRN 9FT ADLT (ELECTROSURGICAL) ×1 IMPLANT
ENDOLOOP SUT PDS II  0 18 (SUTURE)
ENDOLOOP SUT PDS II 0 18 (SUTURE) IMPLANT
GLOVE BIO SURGEON STRL SZ8 (GLOVE) ×2 IMPLANT
GLOVE BIOGEL PI IND STRL 6.5 (GLOVE) IMPLANT
GLOVE BIOGEL PI IND STRL 7.0 (GLOVE) IMPLANT
GLOVE BIOGEL PI IND STRL 8 (GLOVE) ×1 IMPLANT
GLOVE BIOGEL PI INDICATOR 6.5 (GLOVE) ×1
GLOVE BIOGEL PI INDICATOR 7.0 (GLOVE) ×1
GLOVE BIOGEL PI INDICATOR 8 (GLOVE) ×1
GLOVE ECLIPSE 6.5 STRL STRAW (GLOVE) ×1 IMPLANT
GLOVE SURG SS PI 6.5 STRL IVOR (GLOVE) ×2 IMPLANT
GOWN PREVENTION PLUS XLARGE (GOWN DISPOSABLE) ×2 IMPLANT
GOWN STRL NON-REIN LRG LVL3 (GOWN DISPOSABLE) ×5 IMPLANT
KIT BASIN OR (CUSTOM PROCEDURE TRAY) ×2 IMPLANT
KIT ROOM TURNOVER OR (KITS) ×2 IMPLANT
NS IRRIG 1000ML POUR BTL (IV SOLUTION) ×2 IMPLANT
PAD ARMBOARD 7.5X6 YLW CONV (MISCELLANEOUS) ×4 IMPLANT
POUCH SPECIMEN RETRIEVAL 10MM (ENDOMECHANICALS) ×2 IMPLANT
RELOAD /EVU35 (ENDOMECHANICALS) IMPLANT
RELOAD CUTTER ETS 35MM STAND (ENDOMECHANICALS) IMPLANT
SCALPEL HARMONIC ACE (MISCELLANEOUS) ×2 IMPLANT
SET IRRIG TUBING LAPAROSCOPIC (IRRIGATION / IRRIGATOR) ×2 IMPLANT
SPECIMEN JAR SMALL (MISCELLANEOUS) ×2 IMPLANT
SUT VIC AB 4-0 PS2 27 (SUTURE) ×2 IMPLANT
SWAB COLLECTION DEVICE MRSA (MISCELLANEOUS) IMPLANT
TOWEL OR 17X24 6PK STRL BLUE (TOWEL DISPOSABLE) ×2 IMPLANT
TOWEL OR 17X26 10 PK STRL BLUE (TOWEL DISPOSABLE) ×2 IMPLANT
TRAY FOLEY CATH 14FR (SET/KITS/TRAYS/PACK) ×2 IMPLANT
TRAY LAPAROSCOPIC (CUSTOM PROCEDURE TRAY) ×2 IMPLANT
TROCAR HASSON GELL 12X100 (TROCAR) ×2 IMPLANT
TROCAR Z-THREAD FIOS 12X100MM (TROCAR) ×2 IMPLANT
TROCAR Z-THREAD FIOS 5X100MM (TROCAR) ×2 IMPLANT
TUBE ANAEROBIC SPECIMEN COL (MISCELLANEOUS) IMPLANT
WATER STERILE IRR 1000ML POUR (IV SOLUTION) ×1 IMPLANT

## 2011-09-16 NOTE — ED Notes (Signed)
RESIDENT IN TO DISCUSS RESULTS OF CT WITH PT

## 2011-09-16 NOTE — Anesthesia Procedure Notes (Signed)
Procedure Name: Intubation Date/Time: 09/16/2011 2:27 PM Performed by: Elon Alas Pre-anesthesia Checklist: Patient identified, Timeout performed, Emergency Drugs available, Suction available and Patient being monitored Patient Re-evaluated:Patient Re-evaluated prior to inductionOxygen Delivery Method: Circle system utilized Preoxygenation: Pre-oxygenation with 100% oxygen Intubation Type: IV induction, Rapid sequence and Cricoid Pressure applied Laryngoscope Size: Mac and 3 Grade View: Grade II Tube type: Oral Tube size: 7.0 mm Number of attempts: 1 Airway Equipment and Method: Stylet Placement Confirmation: positive ETCO2,  ETT inserted through vocal cords under direct vision and breath sounds checked- equal and bilateral Secured at: 21 cm Tube secured with: Tape Dental Injury: Teeth and Oropharynx as per pre-operative assessment

## 2011-09-16 NOTE — ED Provider Notes (Signed)
I saw and evaluated the patient, reviewed the resident's note and I agree with the findings and plan.   Pt with right lower abd pain, tender on exam, some early, mild peritoneal signs, CT suggests acute appendicitis.  IVF's, keep NPO, IV abx and admit to surgeon.    Gavin Pound. Babak Lucus, MD 09/16/11 1321

## 2011-09-16 NOTE — Anesthesia Postprocedure Evaluation (Signed)
  Anesthesia Post-op Note  Patient: Marilyn Hernandez  Procedure(s) Performed: Procedure(s) (LRB): APPENDECTOMY LAPAROSCOPIC (N/A)  Patient Location: PACU  Anesthesia Type: General  Level of Consciousness: awake, alert  and oriented  Airway and Oxygen Therapy: Patient Spontanous Breathing and Patient connected to face mask oxygen  Post-op Pain: mild  Post-op Assessment: Post-op Vital signs reviewed and Patient's Cardiovascular Status Stable  Post-op Vital Signs: stable  Complications: No apparent anesthesia complications

## 2011-09-16 NOTE — ED Provider Notes (Signed)
History     CSN: [id number]  Arrival date & time 09/16/11  1016   First MD Initiated Contact with Patient 09/16/11 1048      Chief Complaint  Patient presents with  . Abdominal Pain  . Emesis    (Consider location/radiation/quality/duration/timing/severity/associated sxs/prior treatment) Patient is a 61 y.o. female presenting with abdominal pain. The history is provided by the patient.  Abdominal Pain The primary symptoms of the illness include abdominal pain, fever (99.9 this AM) and nausea. The primary symptoms of the illness do not include shortness of breath, vomiting or diarrhea. The current episode started 6 to 12 hours ago. The onset of the illness was sudden. The problem has been gradually worsening.  The pain came on suddenly. The abdominal pain is located in the RLQ (right mid abdomen). The abdominal pain does not radiate. The severity of the abdominal pain is 10/10. The abdominal pain is relieved by nothing. The abdominal pain is exacerbated by movement.  Additional symptoms associated with the illness include chills. Symptoms associated with the illness do not include constipation, urgency, hematuria or back pain.    Past Medical History  Diagnosis Date  . Hyperlipidemia   . Coronary artery disease     s/p NSTEMI 09/2009; s/p CABG 09/2009 [facility], [place]);echo 12/12/2009: EF 55-60%, normal RVF  . Pleural effusion     post CABG  . COPD (chronic obstructive pulmonary disease)   . RA (rheumatoid arthritis)   . Pulmonary nodules     Past Surgical History  Procedure Date  . C-sections     x2  . Arthroscopic knee surgery   . Stapedectomy   . Coronary artery bypass graft     LIMA to the LAD, SVG right coronary artery, SVG to obtuse marginal September 2011     Family History  Problem Relation Age of Onset  . Heart disease Mother     bypass surgery in her 80s  . Arrhythmia Brother     atrial fibrillation    History  Substance Use Topics  . Smoking status:  Former Games developer  . Smokeless tobacco: Never Used  . Alcohol Use: No    OB History    Grav Para Term Preterm Abortions TAB SAB Ect Mult Living                  Review of Systems  Constitutional: Positive for fever (99.9 this AM) and chills.  HENT: Negative.   Respiratory: Negative for cough and shortness of breath.   Cardiovascular: Negative for chest pain.  Gastrointestinal: Positive for nausea and abdominal pain. Negative for vomiting, diarrhea, constipation and blood in stool.  Genitourinary: Negative for urgency, hematuria, decreased urine volume and difficulty urinating.  Musculoskeletal: Negative for back pain.  All other systems reviewed and are negative.    Allergies  Naproxen sodium  Home Medications   Current Outpatient Rx  Name Route Sig Dispense Refill  . ACETAMINOPHEN 325 MG PO TABS Oral Take 650 mg by mouth every 6 (six) hours as needed.      . ASPIRIN 81 MG PO TABS Oral Take 81 mg by mouth daily.      Marland Kitchen CALCIUM MAGNESIUM 500-100-500 PO LIQD Oral Take by mouth.      Marland Kitchen VITAMIN D3 2000 UNITS PO TABS Oral Take 1 capsule by mouth daily.      . CO Q 10 100 MG PO CAPS Oral Take by mouth 2 (two) times daily.      Marland Kitchen DOCUSATE SODIUM  100 MG PO CAPS Oral Take 100 mg by mouth 2 (two) times daily.      Marland Kitchen FLUTICASONE-SALMETEROL 250-50 MCG/DOSE IN AEPB Inhalation Inhale 1 puff into the lungs every 12 (twelve) hours as needed.     Marland Kitchen FOLIC ACID 800 MCG PO TABS Oral Take 400 mcg by mouth daily.      . FUROSEMIDE 20 MG PO TABS Oral Take 1 tablet (20 mg total) by mouth as needed. 45 tablet 10  . GLUCOSAMINE-CHONDROITIN 500-400 MG PO TABS Oral Take 1 tablet by mouth daily.      Marland Kitchen LEFLUNOMIDE 10 MG PO TABS Oral Take 20 mg by mouth.     . METOPROLOL TARTRATE 25 MG PO TABS Oral Take 0.5 tablets (12.5 mg total) by mouth 2 (two) times daily. 30 tablet 10  . NITROGLYCERIN 0.4 MG SL SUBL Sublingual Place 1 tablet (0.4 mg total) under the tongue every 5 (five) minutes as needed. 25 tablet 3    . NON FORMULARY  ELEVIV -- 350 mg  2 in the morning     . FISH OIL 1000 MG PO CAPS Oral Take 3 capsules by mouth daily.      Marland Kitchen POTASSIUM CHLORIDE ER 10 MEQ PO TBCR  TAKE ONE TABLET DAILY WITH FUROSEMIDE OR AS OTHERWISE DIRECTED BY YOUR PHYSICIAN 30 tablet 1  . PREDNISONE 10 MG PO TABS Oral Take 5 mg by mouth daily.     Marland Kitchen ROSUVASTATIN CALCIUM 20 MG PO TABS Oral Take 1 tablet (20 mg total) by mouth daily. 30 tablet 11  . VITAMIN B-12 1000 MCG PO TABS Oral Take 1,000 mcg by mouth daily. With folic acid 400 mcg       BP 123/73  Pulse 95  Temp 98.5 F (36.9 C) (Oral)  Resp 18  SpO2 98%  Physical Exam  Nursing note and vitals reviewed. Constitutional: She is oriented to person, place, and time. She appears well-developed and well-nourished. No distress.  HENT:  Head: Normocephalic and atraumatic.  Right Ear: External ear normal.  Left Ear: External ear normal.  Nose: Nose normal.  Mouth/Throat: Oropharynx is clear and moist.  Eyes: Right eye exhibits no discharge. Left eye exhibits no discharge.  Neck: Neck supple.  Cardiovascular: Normal rate, regular rhythm, normal heart sounds and intact distal pulses.   Pulmonary/Chest: Effort normal and breath sounds normal. No respiratory distress. She has no wheezes. She has no rales.  Abdominal: Soft. She exhibits no distension. There is tenderness in the right upper quadrant and right lower quadrant. There is no rigidity and no rebound.  Musculoskeletal: She exhibits no edema.  Neurological: She is alert and oriented to person, place, and time.  Skin: Skin is warm and dry. She is not diaphoretic. No pallor.    ED Course  Procedures (including critical care time)  Labs Reviewed  CBC WITH DIFFERENTIAL - Abnormal; Notable for the following:    WBC 14.5 (*)     Neutrophils Relative 82 (*)     Neutro Abs 11.9 (*)     Lymphocytes Relative 6 (*)     Monocytes Absolute 1.5 (*)     All other components within normal limits  BASIC METABOLIC  PANEL - Abnormal; Notable for the following:    Glucose, Bld 104 (*)     GFR calc non Af Amer 69 (*)     GFR calc Af Amer 80 (*)     All other components within normal limits  HEPATIC FUNCTION PANEL  LIPASE, BLOOD  URINALYSIS, ROUTINE W REFLEX MICROSCOPIC   Ct Abdomen Pelvis W Contrast  09/16/2011  *RADIOLOGY REPORT*  Clinical Data: Right-sided abdominal pain, fever and nausea  CT ABDOMEN AND PELVIS WITH CONTRAST  Technique:  Multidetector CT imaging of the abdomen and pelvis was performed following the standard protocol during bolus administration of intravenous contrast.  Contrast: OMNIPAQUE IOHEXOL 300 MG/ML  SOLN  Comparison: May 21, 2005  Findings: The appendix is dilated and inflamed.  There is a small appendicolith at the junction of the cecum and appendix.  There is no evidence of abscess or pneumoperitoneum.  Descending colon and sigmoid diverticulosis is noted without radiographic evidence of diverticulitis.  There is no evidence of bowel obstruction.  There is no adenopathy or free fluid within the abdomen or pelvis.  The lung bases are clear.  The liver, gallbladder, spleen, pancreas,  left adrenal gland, kidneys, urinary bladder have a normal appearance.  There is a stable 2 cm lesion on the right adrenal gland, likely an adenoma.  Aortic atherosclerotic calcification is present.  Degenerative changes are present within the axial spine.  IMPRESSION: Appendicitis with no evidence of abscess or rupture.  Findings discussed with Dr. Oletta Lamas.   Original Report Authenticated By: Brandon Melnick, M.D.      1. Appendicitis       MDM  Concern for appendicits. Cholecystitis, renal colic/stone also in differential. Labs, CT scan. No peritonitis on exam though very tender. Appy without abscess or perf on CT scan. Patient's pain improved substantially with morphine. Surgery consulted, they will take to OR.        Pricilla Loveless, MD 09/16/11 1420

## 2011-09-16 NOTE — ED Notes (Signed)
OR IS READY FOR PT 

## 2011-09-16 NOTE — ED Notes (Signed)
Pt transported to CT scan.

## 2011-09-16 NOTE — ED Notes (Signed)
Pt medicated for pain. Will continue to monitor. Vital signs stable at the time. Family at the bedside.

## 2011-09-16 NOTE — Op Note (Signed)
09/16/2011  3:15 PM  PATIENT:  Marilyn Hernandez  61 y.o. female  PRE-OPERATIVE DIAGNOSIS:  ACUTE APPENDICITIS  POST-OPERATIVE DIAGNOSIS:  ACUTE APPENDICITIS  PROCEDURE:  Procedure(s): APPENDECTOMY LAPAROSCOPIC  SURGEON:  Surgeon(s): Liz Malady, MD  PHYSICIAN ASSISTANT:   ASSISTANTS: none   ANESTHESIA:   local and general  EBL:     BLOOD ADMINISTERED:none  DRAINS: none   SPECIMEN:  Excision  DISPOSITION OF SPECIMEN:  PATHOLOGY  COUNTS:  YES  DICTATION: .Dragon DictationPatient presented to the emergency room with right lower quadrant abdominal pain. Workup demonstrates leukocytosis. CT scan showed acute appendicitis. She is brought for emergent appendectomy. Informed consent was obtained. She received intravenous antibiotics. She is brought to the operating room. General endotracheal anesthesia was administered by the anesthesia staff. Foley catheter was placed by nursing. Abdomen was prepped and draped in sterile fashion. We did time out procedure. Due to her previous incisions a supraumbilical site was chosen and was infiltrated with quarter percent Marcaine with epinephrine. Incision was made and subcutaneous tissues were dissected down revealing the anterior fascia. This was divided along the midline and the peritoneal cavity was entered under direct vision. 0 Vicryl pursestring suture was placed on the fascial opening. Hassan trocar was inserted. Abdomen was insufflated with carbon dioxide in standard fashion. Under direct vision a 12 mm lower midline and a 5 mm port were both placed. Local was used at each port site. I avoided intra-abdominal adhesions with their placement. Laparoscopic exploration revealed an inflamed but nonperforated retrocecal appendix. Some lateral attachments of the colon were freed up for good visualization.The appendix was freed up bluntly from lateral peritoneal attachments. The mesoappendix near the base was divided with the harmonic scalpel. The  base was noninflamed. The base along the cecum was divided with Endo GIA stapler with vascular load. Remainder the mesoappendix was divided gradually with harmonic scalpel. Appendix was placed in an Endo Catch bag and removed from the abdomen via the lower midline port site. Abdomen was copiously irrigated with saline. Staple line remained intact. There was excellent hemostasis. Irrigation fluid returned clear.Ports were removed under direct vision. Pneumoperitoneum was released. This supraumbilical fascia was closed by tying the 0 Vicryl pursestring suture. All 3 wounds were copiously irrigated and the skin of each was closed with running 4 Vicryl subcuticular stitch followed by Dermabond. All counts were correct. There no apparent competitions. Patient was taken recovery room in stable condition.  PATIENT DISPOSITION:  PACU - hemodynamically stable.   Delay start of Pharmacological VTE agent (>24hrs) due to surgical blood loss or risk of bleeding:  no  Violeta Gelinas, MD, MPH, FACS Pager: [phone]  8/22/20133:15 PM

## 2011-09-16 NOTE — ED Notes (Signed)
TO OR VIA STRETCHER. PT HUSBAND ACCOMPANYING

## 2011-09-16 NOTE — Progress Notes (Signed)
Bilat hearing aids ret to pt 

## 2011-09-16 NOTE — ED Notes (Signed)
Pt presents to department for evaluation of RLQ abdominal pain. Ongoing since Wednesday night. Pt also states nausea/vomiting and fever. 10/10 pain at the time. RLQ tender to palpation. Bowel sounds present all quadrants. Skin warm and dry. Last BM this morning was normal. Denies urinary symptoms. She is alert and oriented x4.

## 2011-09-16 NOTE — H&P (Signed)
Marilyn Hernandez is an 61 y.o. female.   Chief Complaint: RLQ abdominal pain  HPI: Patient developed RLQ abdominal pain around midnight last night.  She had associated nausea But no vomiting. She had a small bowel movement. The pain persisted into the morning. She came to emergency department for evaluation. Workup here showed leukocytosis of 14,500. CT scan of the abdomen pelvis is consistent with acute appendicitis. She continues to have pain.  Past Medical History  Diagnosis Date  . Hyperlipidemia   . Coronary artery disease     s/p NSTEMI 09/2009; s/p CABG 09/2009 [facility] [place]echo 12/12/2009: EF 55-60%, normal RVF  . Pleural effusion     post CABG  . COPD (chronic obstructive pulmonary disease)   . RA (rheumatoid arthritis)   . Pulmonary nodules     Past Surgical History  Procedure Date  . C-sections     x2  . Arthroscopic knee surgery   . Stapedectomy   . Coronary artery bypass graft     LIMA to the LAD, SVG right coronary artery, SVG to obtuse marginal September 2011     Family History  Problem Relation Age of Onset  . Heart disease Mother     bypass surgery in her 40s  . Arrhythmia Brother     atrial fibrillation   Social History:  reports that she has quit smoking. She has never used smokeless tobacco. She reports that she does not drink alcohol. Her drug history not on file.  Allergies:  Allergies  Allergen Reactions  . Naproxen Sodium      (Not in a hospital admission)  Results for orders placed during the hospital encounter of 09/16/11 (from the past 48 hour(s))  CBC WITH DIFFERENTIAL     Status: Abnormal   Collection Time   09/16/11 10:37 AM      Component Value Range Comment   WBC 14.5 (*) 4.0 - 10.5 K/uL    RBC 4.59  3.87 - 5.11 MIL/uL    Hemoglobin 13.8  12.0 - 15.0 g/dL    HCT 16.1  09.6 - 04.5 %    MCV 90.2  78.0 - 100.0 fL    MCH 30.1  26.0 - 34.0 pg    MCHC 33.3  30.0 - 36.0 g/dL    RDW 40.9  81.1 - 91.4 %    Platelets 218  150 - 400  K/uL    Neutrophils Relative 82 (*) 43 - 77 %    Neutro Abs 11.9 (*) 1.7 - 7.7 K/uL    Lymphocytes Relative 6 (*) 12 - 46 %    Lymphs Abs 0.9  0.7 - 4.0 K/uL    Monocytes Relative 11  3 - 12 %    Monocytes Absolute 1.5 (*) 0.1 - 1.0 K/uL    Eosinophils Relative 1  0 - 5 %    Eosinophils Absolute 0.1  0.0 - 0.7 K/uL    Basophils Relative 0  0 - 1 %    Basophils Absolute 0.0  0.0 - 0.1 K/uL   BASIC METABOLIC PANEL     Status: Abnormal   Collection Time   09/16/11 10:37 AM      Component Value Range Comment   Sodium 142  135 - 145 mEq/L    Potassium 3.7  3.5 - 5.1 mEq/L    Chloride 107  96 - 112 mEq/L    CO2 25  19 - 32 mEq/L    Glucose, Bld 104 (*) 70 - 99 mg/dL  BUN 12  6 - 23 mg/dL    Creatinine, Ser 0.98  0.50 - 1.10 mg/dL    Calcium 9.2  8.4 - 11.9 mg/dL    GFR calc non Af Amer 69 (*) >90 mL/min    GFR calc Af Amer 80 (*) >90 mL/min   HEPATIC FUNCTION PANEL     Status: Normal   Collection Time   09/16/11 11:04 AM      Component Value Range Comment   Total Protein 7.2  6.0 - 8.3 g/dL    Albumin 3.7  3.5 - 5.2 g/dL    AST 22  0 - 37 U/L    ALT 16  0 - 35 U/L    Alkaline Phosphatase 72  39 - 117 U/L    Total Bilirubin 0.6  0.3 - 1.2 mg/dL    Bilirubin, Direct 0.2  0.0 - 0.3 mg/dL    Indirect Bilirubin 0.4  0.3 - 0.9 mg/dL   LIPASE, BLOOD     Status: Normal   Collection Time   09/16/11 11:04 AM      Component Value Range Comment   Lipase 22  11 - 59 U/L    Ct Abdomen Pelvis W Contrast  09/16/2011  *RADIOLOGY REPORT*  Clinical Data: Right-sided abdominal pain, fever and nausea  CT ABDOMEN AND PELVIS WITH CONTRAST  Technique:  Multidetector CT imaging of the abdomen and pelvis was performed following the standard protocol during bolus administration of intravenous contrast.  Contrast: OMNIPAQUE IOHEXOL 300 MG/ML  SOLN  Comparison: May 21, 2005  Findings: The appendix is dilated and inflamed.  There is a small appendicolith at the junction of the cecum and appendix.   There is no evidence of abscess or pneumoperitoneum.  Descending colon and sigmoid diverticulosis is noted without radiographic evidence of diverticulitis.  There is no evidence of bowel obstruction.  There is no adenopathy or free fluid within the abdomen or pelvis.  The lung bases are clear.  The liver, gallbladder, spleen, pancreas,  left adrenal gland, kidneys, urinary bladder have a normal appearance.  There is a stable 2 cm lesion on the right adrenal gland, likely an adenoma.  Aortic atherosclerotic calcification is present.  Degenerative changes are present within the axial spine.  IMPRESSION: Appendicitis with no evidence of abscess or rupture.  Findings discussed with Dr. Oletta Lamas.   Original Report Authenticated By: Brandon Melnick, M.D.     Review of Systems  Constitutional: Negative for fever and chills.  HENT: Negative.   Eyes: Negative.   Respiratory: Negative.   Cardiovascular: Negative.   Gastrointestinal: Positive for nausea, vomiting and abdominal pain. Negative for diarrhea.  Genitourinary: Negative.   Musculoskeletal: Negative.   Skin: Negative.   Neurological: Negative.   Endo/Heme/Allergies: Negative.     Blood pressure 120/78, pulse 87, temperature 98.5 F (36.9 C), temperature source Oral, resp. rate 18, SpO2 99.00%. Physical Exam  Constitutional: She is oriented to person, place, and time. She appears well-developed and well-nourished. No distress.  HENT:  Head: Normocephalic and atraumatic.  Mouth/Throat: No oropharyngeal exudate.  Eyes: EOM are normal. Pupils are equal, round, and reactive to light.  Neck: Normal range of motion. Neck supple. No tracheal deviation present. No thyromegaly present.  Cardiovascular: Normal rate, regular rhythm, normal heart sounds and intact distal pulses.   No murmur heard. Respiratory: Effort normal and breath sounds normal. No stridor. No respiratory distress. She has no wheezes. She has no rales.  GI: Soft. She exhibits no  distension. There is tenderness. There is guarding. There is no rebound.       Tender right lower quadrant with occasional voluntary guarding, lower midline scar, no other tenderness or masses  Musculoskeletal: Normal range of motion.  Neurological: She is alert and oriented to person, place, and time.  Skin: Skin is warm and dry.     Assessment/Plan Acute appendicitis: Will give intravenous antibiotics and proceed to the operating room for emergent laparoscopic appendectomy. Procedure, risks, and benefits were discussed in detail with the patient. I answered her questions. She is agreeable.  Jenavi Beedle E 09/16/2011, 1:28 PM

## 2011-09-16 NOTE — Anesthesia Preprocedure Evaluation (Addendum)
Anesthesia Evaluation  Patient identified by MRN, date of birth, ID band Patient awake    Reviewed: Allergy & Precautions, H&P , NPO status , Patient's Chart, lab work & pertinent test results  Airway Mallampati: I TM Distance: <3 FB Neck ROM: Full    Dental  (+) Dental Advisory Given and Teeth Intact   Pulmonary COPD breath sounds clear to auscultation        Cardiovascular hypertension, Pt. on medications + CAD and + CABG Rhythm:Regular Rate:Normal     Neuro/Psych    GI/Hepatic   Endo/Other    Renal/GU      Musculoskeletal  (+) Arthritis -,   Abdominal (+) + obese,  Abdomen: tender.    Peds  Hematology   Anesthesia Other Findings   Reproductive/Obstetrics                         Anesthesia Physical Anesthesia Plan  ASA: III  Anesthesia Plan: General   Post-op Pain Management:    Induction: Intravenous  Airway Management Planned: Oral ETT  Additional Equipment:   Intra-op Plan:   Post-operative Plan: Extubation in OR  Informed Consent: I have reviewed the patients History and Physical, chart, labs and discussed the procedure including the risks, benefits and alternatives for the proposed anesthesia with the patient or authorized representative who has indicated his/her understanding and acceptance.   Dental advisory given  Plan Discussed with: Anesthesiologist and Surgeon  Anesthesia Plan Comments: (Acute appendicitis CAD S/P CABG 09/2009 no angina, EF 55-60 by echo 12/12/09 htn RA Obesity  Plan GA with RSI  Kipp Brood, MD)       Anesthesia Quick Evaluation

## 2011-09-16 NOTE — Transfer of Care (Signed)
Immediate Anesthesia Transfer of Care Note  Patient: Marilyn Hernandez  Procedure(s) Performed: Procedure(s) (LRB): APPENDECTOMY LAPAROSCOPIC (N/A)  Patient Location: PACU  Anesthesia Type: General  Level of Consciousness: awake and patient cooperative  Airway & Oxygen Therapy: Patient Spontanous Breathing and Patient connected to face mask oxygen  Post-op Assessment: Report given to PACU RN and Post -op Vital signs reviewed and stable  Post vital signs: Reviewed  Complications: No apparent anesthesia complications

## 2011-09-16 NOTE — ED Notes (Signed)
PATIENT IS IN CT. WILL COME TO CDU AFTER

## 2011-09-16 NOTE — ED Notes (Signed)
Pt c/o RLQ with chills and aches starting last night; pt also c/o N/v; pt sent here by PCP for rule out appy

## 2011-09-17 ENCOUNTER — Encounter (HOSPITAL_COMMUNITY): Payer: Self-pay | Admitting: General Surgery

## 2011-09-17 DIAGNOSIS — K3580 Unspecified acute appendicitis: Secondary | ICD-10-CM | POA: Diagnosis present

## 2011-09-17 MED ORDER — ASPIRIN 81 MG PO TABS: 81.0000 mg | ORAL_TABLET | Freq: Every day | ORAL | Status: DC

## 2011-09-17 MED ORDER — ATORVASTATIN CALCIUM 20 MG PO TABS
20.0000 mg | ORAL_TABLET | Freq: Every day | ORAL | Status: DC
  Filled 2011-09-17: qty 1

## 2011-09-17 MED ORDER — ASPIRIN 81 MG PO CHEW
81.0000 mg | CHEWABLE_TABLET | Freq: Every day | ORAL | Status: DC
  Administered 2011-09-17: 81 mg via ORAL
  Filled 2011-09-17: qty 1

## 2011-09-17 MED ORDER — ACETAMINOPHEN 325 MG PO TABS: 650.0000 mg | ORAL_TABLET | Freq: Four times a day (QID) | ORAL | Status: DC | PRN

## 2011-09-17 MED ORDER — OXYCODONE-ACETAMINOPHEN 5-325 MG PO TABS: 1.0000 | ORAL_TABLET | ORAL | Status: AC | PRN

## 2011-09-17 NOTE — Progress Notes (Signed)
Plan d/c Marilyn Gelinas, MD, MPH, FACS Pager: [phone]

## 2011-09-17 NOTE — Discharge Summary (Signed)
Rie Mcneil, MD, MPH, FACS Pager: [phone]  

## 2011-09-17 NOTE — Progress Notes (Signed)
UR complete 

## 2011-09-17 NOTE — Progress Notes (Signed)
1 Day Post-Op  Subjective: Doing well had liquid tray, but regular tray is coming, feels much better, has been up once.  Objective: Vital signs in last 24 hours: Temp:  [97.7 F (36.5 C)-101.5 F (38.6 C)] 98.5 F (36.9 C) (08/23 0538) Pulse Rate:  [62-106] 77  (08/23 0538) Resp:  [10-23] 18  (08/23 0538) BP: (111-177)/(48-79) 130/65 mmHg (08/23 0538) SpO2:  [92 %-99 %] 92 % (08/23 0538) Last BM Date: 09/16/11  Diet: regular, afebrile, VSS, no labs,  Intake/Output from previous day: 08/22 0701 - 08/23 0700 In: 2632 [I.V.:2632] Out: 1100 [Urine:1100] Intake/Output this shift:    General appearance: alert, cooperative and no distress Resp: clear to auscultation bilaterally GI: soft, +Bs, not distended, incsions look good, some flatus.  Lab Results:   Basename 09/16/11 1037  WBC 14.5*  HGB 13.8  HCT 41.4  PLT 218    BMET  Basename 09/16/11 1037  NA 142  K 3.7  CL 107  CO2 25  GLUCOSE 104*  BUN 12  CREATININE 0.88  CALCIUM 9.2   PT/INR No results found for this basename: LABPROT:2,INR:2 in the last 72 hours   Lab 09/16/11 1104  AST 22  ALT 16  ALKPHOS 72  BILITOT 0.6  PROT 7.2  ALBUMIN 3.7     Lipase     Component Value Date/Time   LIPASE 22 09/16/2011 1104     Studies/Results: Ct Abdomen Pelvis W Contrast  09/16/2011  *RADIOLOGY REPORT*  Clinical Data: Right-sided abdominal pain, fever and nausea  CT ABDOMEN AND PELVIS WITH CONTRAST  Technique:  Multidetector CT imaging of the abdomen and pelvis was performed following the standard protocol during bolus administration of intravenous contrast.  Contrast: OMNIPAQUE IOHEXOL 300 MG/ML  SOLN  Comparison: May 21, 2005  Findings: The appendix is dilated and inflamed.  There is a small appendicolith at the junction of the cecum and appendix.  There is no evidence of abscess or pneumoperitoneum.  Descending colon and sigmoid diverticulosis is noted without radiographic evidence of diverticulitis.   There is no evidence of bowel obstruction.  There is no adenopathy or free fluid within the abdomen or pelvis.  The lung bases are clear.  The liver, gallbladder, spleen, pancreas,  left adrenal gland, kidneys, urinary bladder have a normal appearance.  There is a stable 2 cm lesion on the right adrenal gland, likely an adenoma.  Aortic atherosclerotic calcification is present.  Degenerative changes are present within the axial spine.  IMPRESSION: Appendicitis with no evidence of abscess or rupture.  Findings discussed with Dr. Oletta Lamas.   Original Report Authenticated By: Brandon Melnick, M.D.     Medications:    . docusate sodium  100 mg Oral BID  . droperidol  0.625 mg Intravenous Once  . ertapenem  1 g Intravenous Once  . furosemide  20 mg Oral Daily  . heparin  5,000 Units Subcutaneous Q8H  . iohexol  20 mL Oral Q1 Hr x 2  . leflunomide  20 mg Oral Daily  . metoprolol tartrate  12.5 mg Oral BID  .  morphine injection  8 mg Intravenous Once  . ondansetron (ZOFRAN) IV  4 mg Intravenous Once  . potassium chloride  10 mEq Oral Daily  . predniSONE  5 mg Oral Q breakfast  . sodium chloride  1,000 mL Intravenous Once  . DISCONTD: sodium chloride   Intravenous Once  . DISCONTD: leflunomide  20 mg Oral Daily  . DISCONTD: moxifloxacin  400 mg  Intravenous Once  . DISCONTD: predniSONE  5 mg Oral Daily    Assessment/Plan ACUTE APPENDICITIS s/p lap Appendectomy 09/16/11 CAD, NSTEMI, CABG 09/2009. EF 55-60% COPD RA, on steroids, and Arava Pulmonary nodules Dyslipidemia    Plan:  Mobilize, regular diet, get her back on all her home meds, home later today when she is ready.    LOS: 1 day    Kiriana Worthington 09/17/2011

## 2011-09-17 NOTE — Discharge Summary (Signed)
Physician Discharge Summary  Patient ID: Marilyn Hernandez MRN: [id number] DOB/AGE: Sep 10, 1950 61 y.o.  Admit date: 09/16/2011 Discharge date: 09/17/2011  Admission Diagnoses: ACUTE APPENDICITIS CAD, NSTEMI, CABG 09/2009. EF 55-60%  COPD  RA, on steroids, and Arava  Pulmonary nodules  Dyslipidemia   Discharge Diagnoses: Same Active Problems:  Acute appendicitis   PROCEDURES: APPENDECTOMY LAPAROSCOPIC, 09/16/11, [facility] Course: Patient developed RLQ abdominal pain around midnight last night. She had associated nausea But no vomiting. She had a small bowel movement. The pain persisted into the morning. She came to emergency department for evaluation. Workup here showed leukocytosis of 14,500. CT scan of the abdomen pelvis is consistent with acute appendicitis. She continues to have pain.  She was seen in the ER, and after exam taken to the OR for surgery.  She has done well and feels good the following AM.  We will advance her diet,and if she does well plan home later today. Follow up in 2 weeks Dr. Janee Morn  Condition on D/C:  Improved   Disposition:   Discharge Orders    Future Appointments: Provider: Department: Dept Phone: Center:   10/06/2011 8:40 AM [facility] [employer] [phone] [facility]   10/12/2011 11:00 AM [facility] [phone] [facility]   10/12/2011 11:15 AM Rollene Rotunda, MD [facility] [phone] [facility]     Medication List  As of 09/17/2011  9:06 AM   TAKE these medications         acetaminophen 325 MG tablet   Commonly known as: TYLENOL   Take 650 mg by mouth every 6 (six) hours as needed.      ADVAIR DISKUS 250-50 MCG/DOSE Aepb   Generic drug: Fluticasone-Salmeterol   Inhale 1 puff into the lungs every 12 (twelve) hours as needed.      aspirin 81 MG tablet   Take 81 mg by mouth daily.      Calcium Magnesium 500-100-500 Liqd   Take by mouth.      Co Q 10 100  MG Caps   Take by mouth 2 (two) times daily.      docusate sodium 100 MG capsule   Commonly known as: COLACE   Take 100 mg by mouth 2 (two) times daily.      Fish Oil 1000 MG Caps   Take 3 capsules by mouth daily.      folic acid 800 MCG tablet   Commonly known as: FOLVITE   Take 400 mcg by mouth daily.      furosemide 20 MG tablet   Commonly known as: LASIX   Take 1 tablet (20 mg total) by mouth as needed.      glucosamine-chondroitin 500-400 MG tablet   Take 1 tablet by mouth daily.      leflunomide 10 MG tablet   Commonly known as: ARAVA   Take 20 mg by mouth.      metoprolol tartrate 25 MG tablet   Commonly known as: LOPRESSOR   Take 0.5 tablets (12.5 mg total) by mouth 2 (two) times daily.      nitroGLYCERIN 0.4 MG SL tablet   Commonly known as: NITROSTAT   Place 1 tablet (0.4 mg total) under the tongue every 5 (five) minutes as needed.      NON FORMULARY   ELEVIV -- 350 mg  2 in the morning      oxyCODONE-acetaminophen 5-325 MG per tablet   Commonly known as: PERCOCET/ROXICET   Take 1-2 tablets  by mouth every 4 (four) hours as needed.      potassium chloride 10 MEQ tablet   Commonly known as: K-DUR   TAKE ONE TABLET DAILY WITH FUROSEMIDE OR AS OTHERWISE DIRECTED BY YOUR PHYSICIAN      predniSONE 10 MG tablet   Commonly known as: DELTASONE   Take 5 mg by mouth daily.      rosuvastatin 20 MG tablet   Commonly known as: CRESTOR   Take 1 tablet (20 mg total) by mouth daily.      vitamin B-12 1000 MCG tablet   Commonly known as: CYANOCOBALAMIN   Take 1,000 mcg by mouth daily. With folic acid 400 mcg      Vitamin D3 2000 UNITS Tabs   Take 1 capsule by mouth daily.           Follow-up Information    Follow up with [facility] E, MD. Schedule an appointment as soon as possible for a visit in 2 weeks. (Make an appointment in 2-3 weeks)    Contact information:   [address] [place] [address] [phone]           Signed: Sherrie George 09/17/2011, 9:06 AM

## 2011-10-06 ENCOUNTER — Other Ambulatory Visit (INDEPENDENT_AMBULATORY_CARE_PROVIDER_SITE_OTHER): Payer: PRIVATE HEALTH INSURANCE

## 2011-10-06 ENCOUNTER — Ambulatory Visit (INDEPENDENT_AMBULATORY_CARE_PROVIDER_SITE_OTHER): Payer: BC Managed Care – PPO | Admitting: General Surgery

## 2011-10-06 ENCOUNTER — Encounter (INDEPENDENT_AMBULATORY_CARE_PROVIDER_SITE_OTHER): Payer: Self-pay | Admitting: General Surgery

## 2011-10-06 VITALS — BP 144/83 | HR 71 | Temp 98.6°F | Resp 18 | Ht 69.0 in | Wt 217.6 lb

## 2011-10-06 DIAGNOSIS — Z9049 Acquired absence of other specified parts of digestive tract: Secondary | ICD-10-CM | POA: Insufficient documentation

## 2011-10-06 DIAGNOSIS — E7800 Pure hypercholesterolemia, unspecified: Secondary | ICD-10-CM

## 2011-10-06 DIAGNOSIS — Z9889 Other specified postprocedural states: Secondary | ICD-10-CM

## 2011-10-06 LAB — LIPID PANEL
HDL: 49.4 mg/dL (ref >39.00)
LDL Cholesterol: 79 mg/dL (ref 0–99)
Total CHOL/HDL Ratio: 3
Triglycerides: 76 mg/dL (ref 0.0–149.0)

## 2011-10-06 NOTE — Progress Notes (Signed)
Subjective:     Patient ID: Marilyn Hernandez, female   DOB: 1950-07-11, 61 y.o.   MRN: [id number]  HPI Patient present status post arthroscopic appendectomy. She is feeling fine. She is not taking pain medication anymore.  Review of Systems     Objective:   Physical Exam Abdomen soft and nontender. Incisions are clean dry and intact. No evidence of infection.    Assessment:     Doing very well status post laparoscopic appendectomy    Plan:     Return when necessary

## 2011-10-11 ENCOUNTER — Telehealth: Payer: Self-pay

## 2011-10-11 NOTE — Telephone Encounter (Signed)
Message copied by Yolonda Kida on Mon Oct 11, 2011  2:39 PM ------      Message from: Rollene Rotunda      Created: Mon Oct 11, 2011  8:28 AM       Labs OK.  Call Ms. Kincannon with the results and send results to [facility] L, MD

## 2011-10-11 NOTE — Telephone Encounter (Signed)
Patient aware of lab results.

## 2011-10-12 ENCOUNTER — Ambulatory Visit (INDEPENDENT_AMBULATORY_CARE_PROVIDER_SITE_OTHER): Payer: BC Managed Care – PPO | Admitting: Cardiology

## 2011-10-12 ENCOUNTER — Encounter: Payer: Self-pay | Admitting: Cardiology

## 2011-10-12 VITALS — BP 152/81 | HR 56 | Ht 69.0 in | Wt 223.4 lb

## 2011-10-12 DIAGNOSIS — Z951 Presence of aortocoronary bypass graft: Secondary | ICD-10-CM

## 2011-10-12 MED ORDER — FUROSEMIDE 20 MG PO TABS: 20.0000 mg | ORAL_TABLET | ORAL | Status: DC | PRN

## 2011-10-12 MED ORDER — POTASSIUM CHLORIDE ER 10 MEQ PO TBCR: 10.0000 meq | EXTENDED_RELEASE_TABLET | Freq: Every day | ORAL | Status: DC

## 2011-10-12 MED ORDER — NITROGLYCERIN 0.4 MG SL SUBL: 0.4000 mg | SUBLINGUAL_TABLET | SUBLINGUAL | Status: DC | PRN

## 2011-10-12 NOTE — Progress Notes (Signed)
HPI [name] presents for evaluation status post bypass.  She is doing well.  She did have appendicitis and needed surgery recently. She did well with this. Because of this she's been away from her exercise for a few weeks area she's gained a little weight. The patient denies any new symptoms such as chest discomfort, neck or arm discomfort. There has been no new shortness of breath, PND or orthopnea. There have been no reported palpitations, presyncope or syncope.  She does have episodes of diaphoresis and hot flashes which have been unchanged from last year. She also occasionally gets some burning discomfort over the mid sternal incision that is different than her pervious angina.    Allergies  Allergen Reactions  . Naproxen Sodium     Current Outpatient Prescriptions  Medication Sig Dispense Refill  . acetaminophen (TYLENOL) 325 MG tablet Take 650 mg by mouth every 6 (six) hours as needed.        Marland Kitchen aspirin 81 MG tablet Take 81 mg by mouth daily.        . Calcium Magnesium 500-100-500 LIQD Take by mouth.        . Cholecalciferol (VITAMIN D3) 2000 UNITS TABS Take 1 capsule by mouth daily.        . Coenzyme Q10 (CO Q 10) 100 MG CAPS Take by mouth 2 (two) times daily.        Marland Kitchen docusate sodium (COLACE) 100 MG capsule Take 100 mg by mouth 2 (two) times daily.        . Fluticasone-Salmeterol (ADVAIR DISKUS) 250-50 MCG/DOSE AEPB Inhale 1 puff into the lungs every 12 (twelve) hours as needed.       . furosemide (LASIX) 20 MG tablet Take 1 tablet (20 mg total) by mouth as needed.  45 tablet  10  . glucosamine-chondroitin 500-400 MG tablet Take 1 tablet by mouth daily.        Marland Kitchen leflunomide (ARAVA) 10 MG tablet Take 20 mg by mouth.       . metoprolol tartrate (LOPRESSOR) 25 MG tablet Take 0.5 tablets (12.5 mg total) by mouth 2 (two) times daily.  30 tablet  10  . nitroGLYCERIN (NITROSTAT) 0.4 MG SL tablet Place 1 tablet (0.4 mg total) under the tongue every 5 (five) minutes as needed.  25  tablet  3  . Omega-3 Fatty Acids (FISH OIL) 1000 MG CAPS Take 3 capsules by mouth daily.        Marland Kitchen oxyCODONE-acetaminophen (PERCOCET/ROXICET) 5-325 MG per tablet       . potassium chloride (K-DUR) 10 MEQ tablet TAKE ONE TABLET DAILY WITH FUROSEMIDE OR AS OTHERWISE DIRECTED BY YOUR PHYSICIAN  30 tablet  1  . predniSONE (DELTASONE) 10 MG tablet Take 5 mg by mouth daily.       . rosuvastatin (CRESTOR) 20 MG tablet Take 1 tablet (20 mg total) by mouth daily.  30 tablet  11  . vitamin B-12 (CYANOCOBALAMIN) 1000 MCG tablet Take 1,000 mcg by mouth daily. With folic acid 400 mcg       . folic acid (FOLVITE) 800 MCG tablet Take 400 mcg by mouth daily.          Past Medical History  Diagnosis Date  . Hyperlipidemia   . Coronary artery disease     s/p NSTEMI 09/2009; s/p CABG 09/2009 [facility] [place]);echo 12/12/2009: EF 55-60%, normal RVF  . Pleural effusion     post CABG  . COPD (chronic obstructive pulmonary disease)   .  RA (rheumatoid arthritis)   . Pulmonary nodules   . Acute appendicitis 09/17/2011    Past Surgical History  Procedure Date  . C-sections     x2  . Arthroscopic knee surgery   . Stapedectomy   . Coronary artery bypass graft     LIMA to the LAD, SVG right coronary artery, SVG to obtuse marginal September 2011   . Laparoscopic appendectomy 09/16/2011    Procedure: APPENDECTOMY LAPAROSCOPIC;  Surgeon: Liz Malady, MD;  Location: [facility] OR;  Service: General;  Laterality: N/A;  . Appendectomy     ROS:  As stated in the HPI and negative for all other systems.  PHYSICAL EXAM BP 152/81  Pulse 56  Ht 5\' 9"  (1.753 m)  Wt 223 lb 6.4 oz (101.334 kg)  BMI 32.99 kg/m2 GENERAL:  Well appearing HEENT:  Pupils equal round and reactive, fundi not visualized, oral mucosa unremarkable NECK:  No jugular venous distention, waveform within normal limits, carotid upstroke brisk and symmetric, no bruits, no thyromegaly LYMPHATICS:  No cervical, inguinal adenopathy LUNGS:  Clear to  auscultation bilaterally BACK:  No CVA tenderness CHEST:  Well healed sternotomy scar. HEART:  PMI not displaced or sustained,S1 and S2 within normal limits, no S3, no S4, no clicks, no rubs, no murmurs ABD:  Flat, positive bowel sounds normal in frequency in pitch, no bruits, no rebound, no guarding, no midline pulsatile mass, no hepatomegaly, no splenomegaly EXT:  2 plus pulses throughout, no edema, no cyanosis no clubbing SKIN:  No rashes no nodules NEURO:  Cranial nerves II through XII grossly intact, motor grossly intact throughout PSYCH:  Cognitively intact, oriented to person place and time  EKG:  Sinus bradycardia, rate 56, RAD, RSR' V1 and no acute ST T wave changes.  10/12/2011  ASSESSMENT AND PLAN   CORONARY ARTERY BYPASS GRAFT, HX OF -  The patient has no new sypmtoms. No further cardiovascular testing is indicated. We will continue with aggressive risk reduction and meds as listed.    HTN (hypertension) -  Although her blood pressure is slightly elevated today she says it's well controlled at home. Asked her to bring her blood pressure cuff to her primary provider with loss to verify the accuracy. For now I will not change her medications.   HYPERCHOLESTEROLEMIA -  HAMRICK,MAURA L, MD increase the Crestor recently per the patient.  The LDL on 9/11 was 79 with and HDL of 49.9.  She will continue on the current therapy.

## 2011-10-12 NOTE — Patient Instructions (Addendum)
The current medical regimen is effective;  continue present plan and medications.  Follow up in 1 year with Dr Antoine Poche.  You will receive a letter in the mail 2 months before you are due.  Please call us when you receive this letter to schedule your follow up appointment.  Cardiac Diet This diet can help prevent heart disease and stroke. Many factors influence your heart health, including eating and exercise habits. Coronary risk rises a lot with abnormal blood fat (lipid) levels. Cardiac meal planning includes limiting unhealthy fats, increasing healthy fats, and making other small dietary changes. General guidelines are as follows:  Adjust calorie intake to reach and maintain desirable body weight.   Limit total fat intake to less than 30% of total calories. Saturated fat should be less than 7% of calories.   Saturated fats are found in animal products and in some vegetable products. Saturated vegetable fats are found in coconut oil, cocoa butter, palm oil, and palm kernel oil. Read labels carefully to avoid these products as much as possible. Use butter in moderation. Choose tub margarines and oils that have 2 grams of fat or less. Good cooking oils are canola and olive oils.   Practice low-fat cooking techniques. Do not fry food. Instead, broil, bake, boil, steam, grill, roast on a rack, stir-fry, or microwave it. Other fat reducing suggestions include:   Remove the skin from poultry.   Remove all visible fat from meats.   Skim the fat off stews, soups, and gravies before serving them.   Steam vegetables in water or broth instead of sauting them in fat.   Avoid foods with trans fat (or hydrogenated oils), such as commercially fried foods and commercially baked goods. Commercial shortening and deep-frying fats will contain trans fat.   Increase intake of fruits, vegetables, whole grains, and legumes to replace foods high in fat.   Increase consumption of nuts, legumes, and seeds to at  least 4 servings weekly. One serving of a legume equals  cup, and 1 serving of nuts or seeds equals  cup.   Choose whole grains more often. Have 3 servings per day (a serving is 1 ounce [oz]).   Have at least 4 cups of fruit and vegetable a day.   Increase your intake of soluble fiber to 10 to 25 grams per day. Soluble fiber binds cholesterol to be removed from the blood. Foods high in soluble fiber are dried beans, citrus fruits, oats, apples, bananas, broccoli, Brussels sprouts, and eggplant.   Try to include foods fortified with plant sterols or stanols, such as yogurt, breads, juices, or margarines. Choose several fortified foods to achieve a daily intake of 2 to 3 grams of plant sterols or stanols.   Foods with omega-3 fats can help reduce your risk of heart disease. Aim to have a 3.5 oz portion of fatty fish twice per week, such as salmon, mackerel, albacore tuna, sardines, lake trout, or herring. If you wish to take a fish oil supplement, choose one that contains 1 gram of both DHA and EPA.   Limit processed meats to 2 servings (3 oz portion) weekly.   Limit the sodium in your diet to 1500 milligrams (mg) per day. If you have high blood pressure, talk to a registered dietitian about a DASH (Dietary Approaches to Stop Hypertension) eating plan.   Limit beverages with added sugar, such as soda, to no more than 36 ounces per week.  CHOOSING FOODS Starches  Allowed: Breads: All kinds (wheat,  rye, raisin, white, oatmeal, [place], [place], and English muffin bread). Low-fat rolls: English muffins, frankfurter and hamburger buns, bagels, pita bread, tortillas (not fried). Pancakes, waffles, biscuits, and muffins made with recommended oil.   Avoid: Products made with saturated or trans fats, oils, or whole milk products. Butter rolls, cheese breads, croissants. Commercial doughnuts, muffins, sweet rolls, biscuits, waffles, pancakes, store-bought mixes.  Crackers  Allowed: Low-fat crackers  and snacks: Animal, graham, rye, saltine (with recommended oil, no lard), oyster, and matzo crackers. Bread sticks, melba toast, rusks, flatbread, pretzels, and light popcorn.   Avoid: High-fat crackers: cheese crackers, butter crackers, and those made with coconut, palm oil, or trans fat (hydrogenated oils). Buttered popcorn.  Cereals  Allowed: Hot or cold whole-grain cereals.   Avoid: Cereals containing coconut, hydrogenated vegetable fat, or animal fat.  Potatoes / Pasta / Rice  Allowed: All kinds of potatoes, rice, and pasta (such as macaroni, spaghetti, and noodles).   Avoid: Pasta or rice prepared with cream sauce or high-fat cheese. Chow mein noodles, [place] fries.  Vegetables  Allowed: All vegetables and vegetable juices.   Avoid: Fried vegetables. Vegetables in cream, butter, or high-fat cheese sauces. Limit coconut. Fruit in cream or custard.  Meat and Meat Substitutes  Allowed: Limit your intake of meat, seafood, and poultry to no more than 6 oz (cooked weight) per day. All lean, well-trimmed beef, veal, pork, and lamb. All chicken and [place] without skin. All fish and shellfish. Wild game: wild duck, rabbit, pheasant, and venison. Meatless dishes: recipes with dried beans, peas, lentils, and tofu (soybean curd). Seeds and nuts: all seeds and most nuts.   Avoid: Prime grade and other heavily marbled and fatty meats, such as short ribs, spare ribs, rib eye roast or steak, frankfurters, sausage, bacon, and high-fat luncheon meats, mutton. Caviar. Commercially fried fish. Domestic duck, goose, venison sausage. Organ meats: liver, gizzard, heart, chitterlings, brains, kidney, sweetbreads.  Dairy  Allowed: Egg whites or low-cholesterol egg substitutes may be used as desired. Low-fat cheeses: nonfat or low-fat cottage cheese (1% or 2% fat), cheeses made with part skim milk, such as mozzarella, farmers, string, or ricotta. (Cheeses should be labeled no more than 2 to 6 grams fat per oz.)    Avoid: Whole milk cheeses, including colby, cheddar, [place], [address], [place], [place], [place], [address], Swiss, and blue. Creamed cottage cheese, cream cheese.  Milk  Allowed: Skim (or 1%) milk: liquid, powdered, or evaporated. Buttermilk made with low-fat milk. Drinks made with skim or low-fat milk or cocoa. Chocolate milk or cocoa made with skim or low-fat (1%) milk. Nonfat or low-fat yogurt.   Avoid: Whole milk and whole milk products, including buttermilk or yogurt made from whole milk, drinks made from whole milk. Condensed milk, evaporated whole milk, and 2% milk.  Soups and Combination Foods  Allowed: Low-fat low-sodium soups: broth, dehydrated soups, homemade broth, soups with the fat removed, homemade cream soups made with skim or low-fat milk. Low-fat spaghetti, lasagna, chili, and Spanish rice if low-fat ingredients and low-fat cooking techniques are used.   Avoid: Cream soups made with whole milk, cream, or high-fat cheese. All other soups.  Desserts and Sweets  Allowed: Sherbet, fruit ices, gelatins, meringues, and angel food cake. Homemade desserts with recommended fats, oils, and milk products. Jam, jelly, honey, marmalade, sugars, and syrups. Pure sugar candy, such as gum drops, hard candy, jelly beans, marshmallows, mints, and small amounts of dark chocolate.   Avoid: Commercially prepared cakes, pies, cookies, frosting, pudding, or mixes for  these products. Desserts containing whole milk products, chocolate, coconut, lard, palm oil, or palm kernel oil. Ice cream or ice cream drinks. Candy that contains chocolate, coconut, butter, hydrogenated fat, or unknown ingredients. Buttered syrups.  Fats and Oils  Allowed: Vegetable oils: safflower, sunflower, corn, soybean, cottonseed, sesame, canola, olive, or peanut. Non-hydrogenated margarines. Salad dressing or mayonnaise: homemade or commercial, made with a recommended oil. Low or nonfat salad dressing or mayonnaise.    Limit added fats and oils to 6 to 8 tsp per day (includes fats used in cooking, baking, salads, and spreads on bread). Remember to count the "hidden fats" in foods.   Avoid: Solid fats and shortenings: butter, lard, salt pork, bacon drippings. Gravy containing meat fat, shortening, or suet. Cocoa butter, coconut. Coconut oil, palm oil, palm kernel oil, or hydrogenated oils: these ingredients are often used in bakery products, nondairy creamers, whipped toppings, candy, and commercially fried foods. Read labels carefully. Salad dressings made of unknown oils, sour cream, or cheese, such as blue cheese and Roquefort. Cream, all kinds: half-and-half, light, heavy, or whipping. Sour cream or cream cheese (even if "light" or low-fat). Nondairy cream substitutes: coffee creamers and sour cream substitutes made with palm, palm kernel, hydrogenated oils, or coconut oil.  Beverages  Allowed: Coffee (regular or decaffeinated), tea. diet carbonated beverages, mineral water. Alcohol: Check with your caregiver. Moderation is recommended.   Avoid: Whole milk, regular sodas, and juice drinks with added sugar.  Condiments  Allowed: All seasonings and condiments. Cocoa powder. "Cream" sauces made with recommended ingredients.   Avoid: Carob powder made with hydrogenated fats.  SAMPLE MENU Breakfast   cup orange juice    cup oatmeal   1 slice toast   1 tsp margarine   1 cup skim milk  Lunch  [place] sandwich with 2 oz [place], 2 slices bread   Lettuce and tomato slices   Fresh fruit   Carrot sticks   Coffee or tea   Snack   Fresh fruit or low-fat crackers  Dinner  3 oz lean ground beef   1 baked potato   1 tsp margarine    cup asparagus   Lettuce salad   1 tbs non-creamy dressing    cup peach slices   1 cup skim milk  Document Released: 10/21/2007 Document Revised: 12/31/2010 Document Reviewed: 01/13/2010 [facility] Patient Information 2012 [place], [place].

## 2011-11-02 ENCOUNTER — Other Ambulatory Visit: Payer: Self-pay | Admitting: Cardiology

## 2012-06-12 IMAGING — CT CT ANGIO CHEST
2 of 6 series · 18 of 36 positions shown · IV contrast (APPLIED)
Comparison: Plain film of 11/10/2009.  No prior CT.

CLINICAL DATA: CABG 3 weeks ago.  Shortness of breath since
surgery.  Progressive.  Severe left upper chest pain this morning.
Cough.

CT ANGIOGRAPHY OF THE CHEST
TECHNIQUE: Multidetector CT angiography of the chest was performed
after contrast with bolus timed to evaluate the pulmonary arteries.
Multiplanar CT image reconstructions including MIPs were obtained
to evaluate the vascular anatomy.
Contrast:  100  ml [id number]

[Series 9: pulm embolism 1.0 b25f thins · axial · 0.65mm/px · z∈[-295,-55]mm · 17 of 268 slices shown]
[im 14/268  lung]
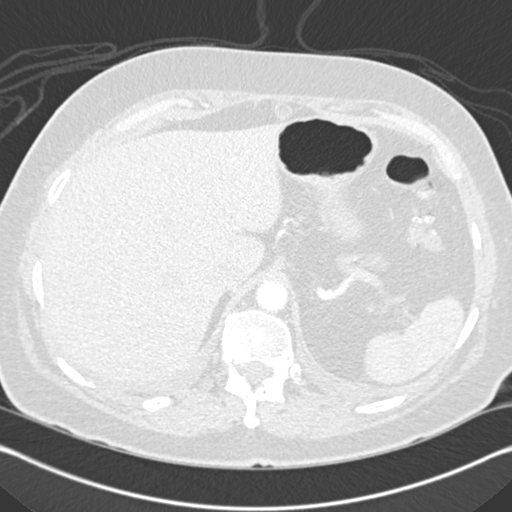
[im 27/268  mediastinal]
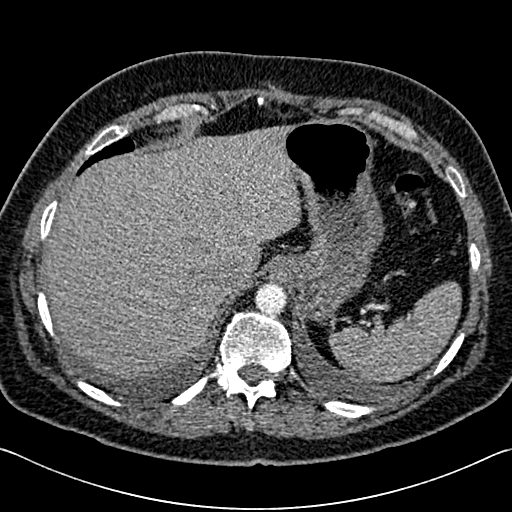
[im 41/268  lung]
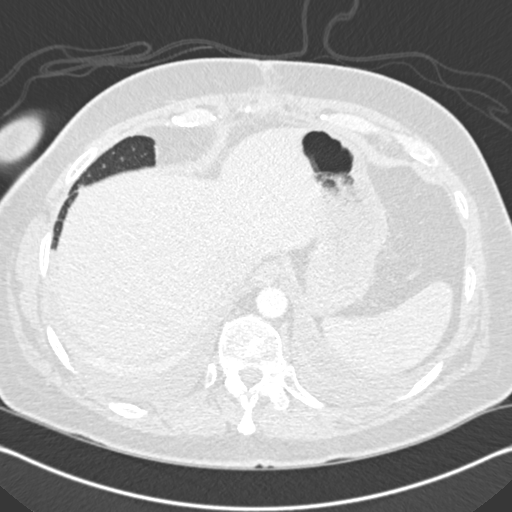
[im 54/268  mediastinal]
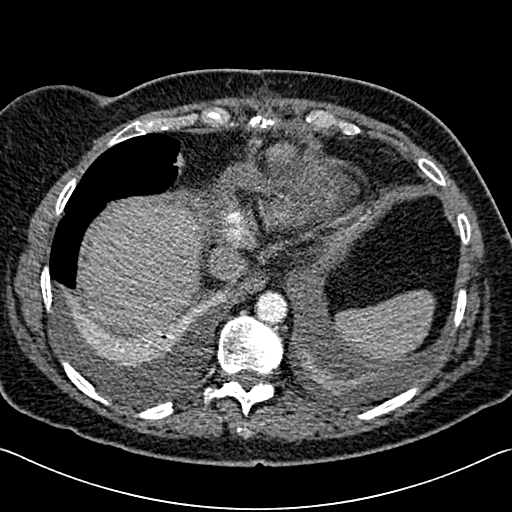
[im 81/268  lung]
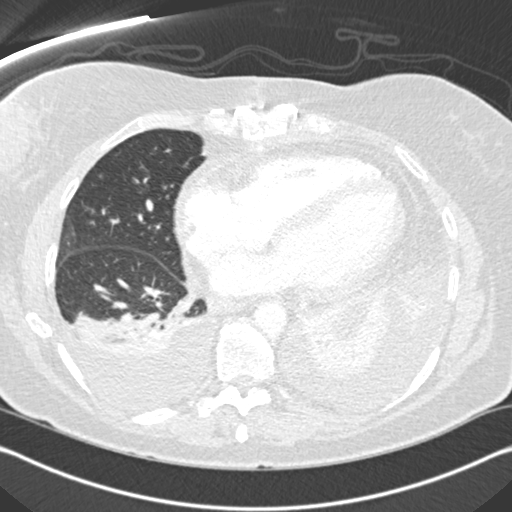
[im 94/268  mediastinal]
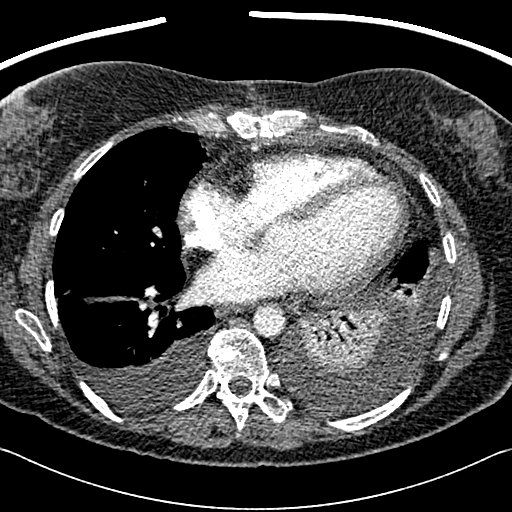
[im 107/268  lung]
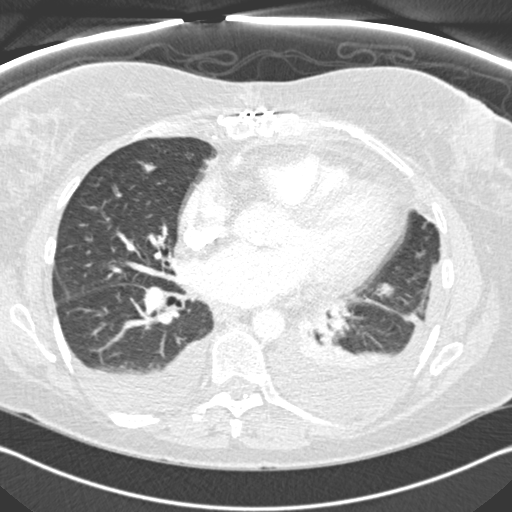
[im 121/268  mediastinal]
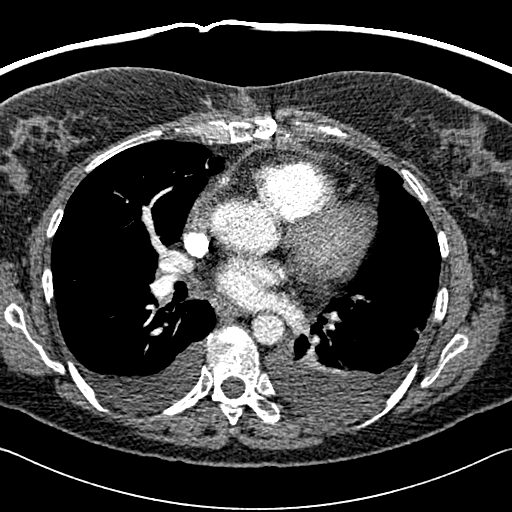
[im 134/268  lung]
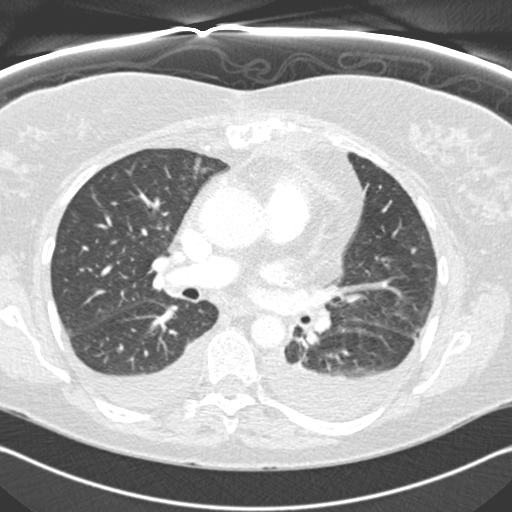
[im 147/268  mediastinal]
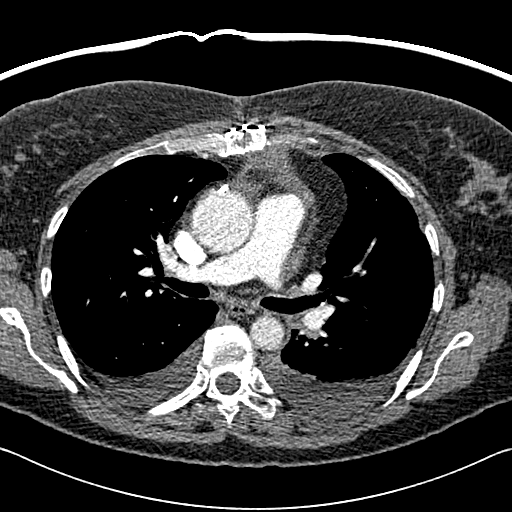
[im 161/268  lung]
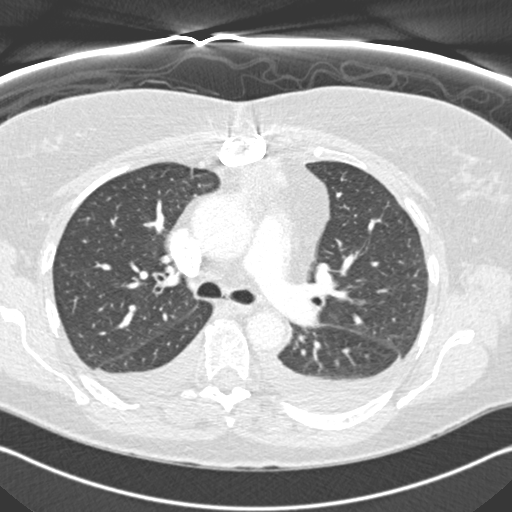
[im 174/268  mediastinal]
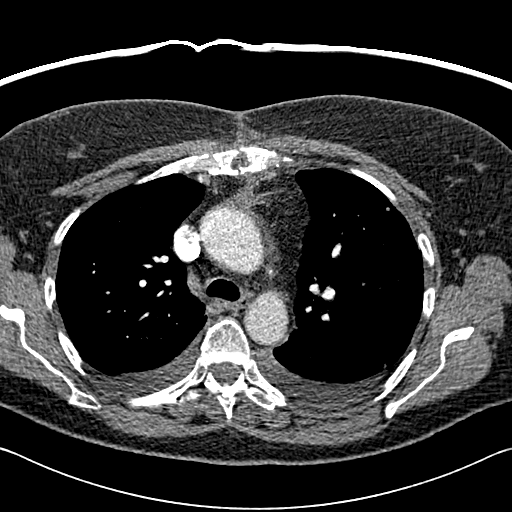
[im 187/268  lung]
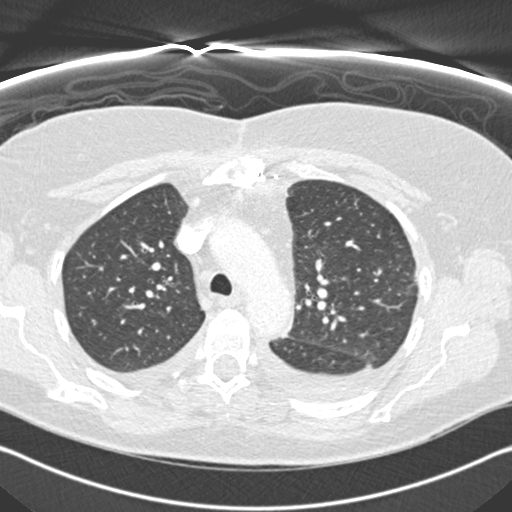
[im 214/268  mediastinal]
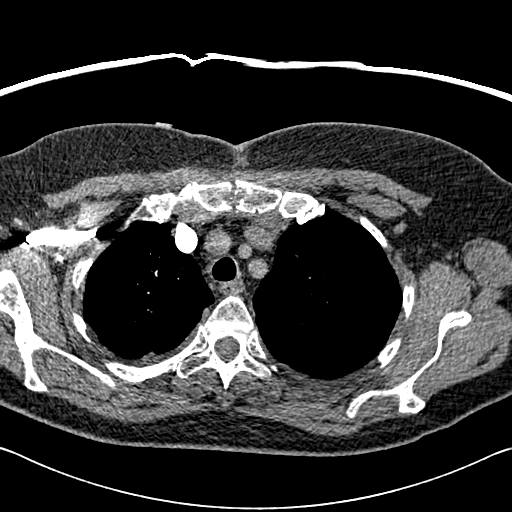
[im 227/268  lung]
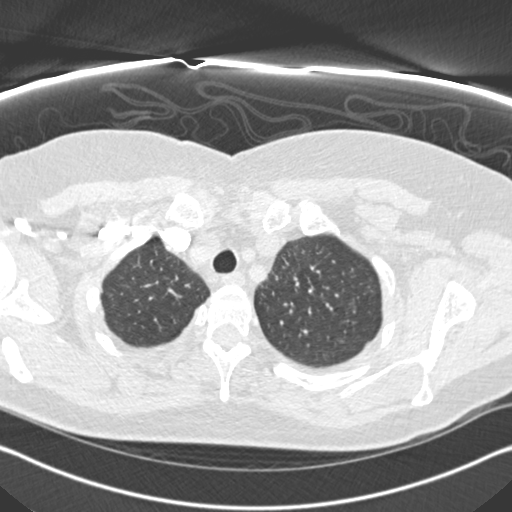
[im 241/268  mediastinal]
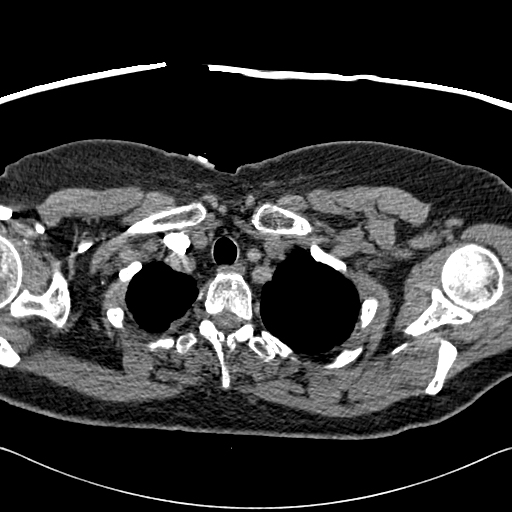
[im 254/268  lung]
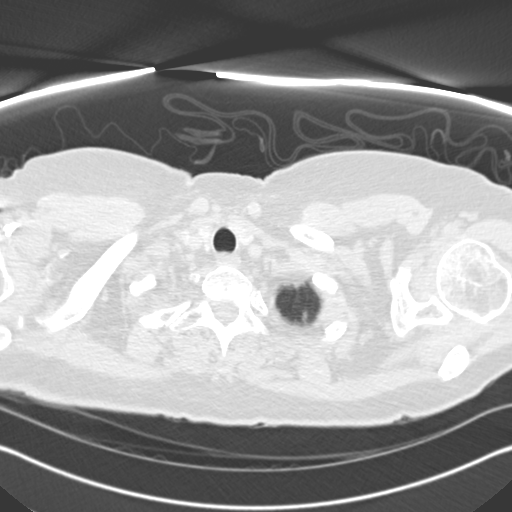

[Series 602: coronal mpr · coronal · 0.65mm/px · 1 of 72 slices shown]
[im 36/72  mediastinal]
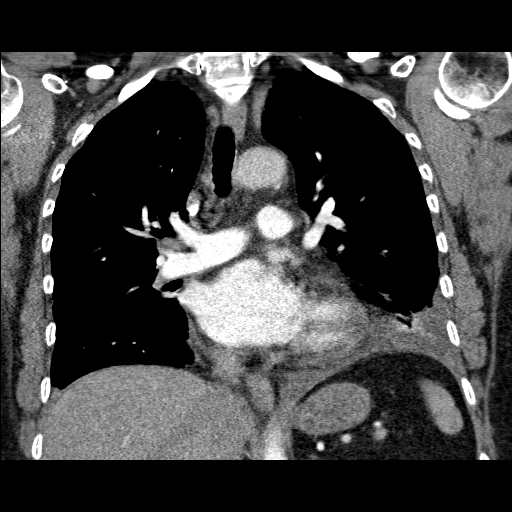

[18 of 36 positions shown; findings below may reference images not displayed]

FINDINGS: Lung windows demonstrate mild motion artifact, primarily
inferiorly.  Bibasilar atelectasis.  Bilateral lung nodules.  Index
left upper lobe nodule measures 6 mm on image 35.

Soft tissue windows:  The quality of this exam for evaluation of
pulmonary embolism is moderate.  Limitations include the motion
artifact and mildly suboptimal bolus timing.

No evidence of embolism to the lobar or large segmental level.
Smaller emboli cannot be excluded, especially to the lower lobes.

Ascending aorta mildly dilated.  4.2 cm on image 39 of series 8.
No dissection.  Mild cardiomegaly. Prior median sternotomy.  Small
bilateral pleural effusions.  No mediastinal adenopathy.  Mild
right hilar adenopathy, 1.5 cm.

Ill-defined fluid within the anterior mediastinum is likely
postoperative.  No surrounding enhancement or air to confirm
infection or abscess.

Limited abdominal imaging demonstrates no significant findings.
No acute osseous abnormality.

 Review of the MIP images confirms the above findings.
IMPRESSION: 1.  Moderate quality exam for pulmonary embolism.  Limitations
include mild motion artifact and mildly suboptimal bolus timing.
No evidence of pulmonary embolism to the large segmental level.
2.  Findings likely of congestive heart failure, with cardiomegaly
and bilateral pleural effusions/bibasilar atelectasis.
3.  Bilateral pulmonary nodules.  Correlate with any history of
primary malignancy.  If there is no such history, consider follow-
up with chest CT at 3 - 6 months to confirm stability.
4.  Mild aneurysmal dilatation of the ascending aorta.
5.  Median sternotomy changes with anterior mediastinal ill-defined
fluid.  Slightly  greater than typically seen 3 weeks after CABG.
Although there are no specific  features of infection, early
infection or sternal dehiscence cannot be excluded.
6.  Mild right hilar adenopathy.  Likely reactive or related to
congestive heart failure. Recommend attention on follow-up.

## 2012-09-07 ENCOUNTER — Other Ambulatory Visit: Payer: Self-pay | Admitting: Cardiology

## 2012-10-09 ENCOUNTER — Other Ambulatory Visit: Payer: Self-pay | Admitting: Cardiology

## 2012-11-02 ENCOUNTER — Other Ambulatory Visit: Payer: Self-pay | Admitting: Cardiology

## 2012-11-06 ENCOUNTER — Other Ambulatory Visit: Payer: Self-pay | Admitting: Family Medicine

## 2012-11-06 DIAGNOSIS — Z1231 Encounter for screening mammogram for malignant neoplasm of breast: Secondary | ICD-10-CM

## 2012-11-06 DIAGNOSIS — E2839 Other primary ovarian failure: Secondary | ICD-10-CM

## 2012-11-09 ENCOUNTER — Other Ambulatory Visit: Payer: Self-pay | Admitting: Cardiology

## 2012-11-13 ENCOUNTER — Other Ambulatory Visit: Payer: Self-pay | Admitting: Cardiology

## 2012-11-14 ENCOUNTER — Encounter: Payer: Self-pay | Admitting: Cardiology

## 2012-11-14 ENCOUNTER — Ambulatory Visit (INDEPENDENT_AMBULATORY_CARE_PROVIDER_SITE_OTHER): Payer: BC Managed Care – PPO | Admitting: Cardiology

## 2012-11-14 VITALS — BP 144/96 | HR 80 | Ht 69.0 in | Wt 214.0 lb

## 2012-11-14 DIAGNOSIS — I4891 Unspecified atrial fibrillation: Secondary | ICD-10-CM

## 2012-11-14 DIAGNOSIS — I10 Essential (primary) hypertension: Secondary | ICD-10-CM

## 2012-11-14 DIAGNOSIS — I2510 Atherosclerotic heart disease of native coronary artery without angina pectoris: Secondary | ICD-10-CM

## 2012-11-14 MED ORDER — METOPROLOL TARTRATE 25 MG PO TABS: ORAL_TABLET | ORAL | Status: DC

## 2012-11-14 MED ORDER — ROSUVASTATIN CALCIUM 20 MG PO TABS: 20.0000 mg | ORAL_TABLET | Freq: Every day | ORAL | Status: DC

## 2012-11-14 NOTE — Progress Notes (Signed)
HPI [name] presents for evaluation status post bypass.  She is doing well.  The patient denies any new symptoms such as chest discomfort, neck or arm discomfort. There has been no new shortness of breath, PND or orthopnea. There have been no reported palpitations, presyncope or syncope.  She  Is walking routinely. He she is trying to use some dietary supplements as described below and actually has lost weight.    She is currently reduced her dose to Crestor and is trying to watch her diet more frequently. She walks 15 minutes twice a day. With this she has no other cardiovascular symptoms that she had previously.   Allergies  Allergen Reactions  . Naproxen Sodium     Current Outpatient Prescriptions  Medication Sig Dispense Refill  . aspirin 81 MG tablet Take 81 mg by mouth daily.        . Calcium Magnesium 500-100-500 LIQD Take by mouth.        . Cholecalciferol (VITAMIN D3) 2000 UNITS TABS Take 1 capsule by mouth daily.        . Coenzyme Q10 (CO Q 10) 100 MG CAPS Take by mouth 2 (two) times daily.        . folic acid (FOLVITE) 800 MCG tablet Take 400 mcg by mouth daily.        . furosemide (LASIX) 20 MG tablet TAKE 1 TABLET BY MOUTH AS NEEDED  45 tablet  1  . glucosamine-chondroitin 500-400 MG tablet Take 1 tablet by mouth daily.        Marland Kitchen leflunomide (ARAVA) 10 MG tablet Take 20 mg by mouth.       . metoprolol tartrate (LOPRESSOR) 25 MG tablet TAKE 1/2 TABLET BY MOUTH TWICE A DAY *GENERIC FOR LOPRESSOR*  30 tablet  0  . nitroGLYCERIN (NITROSTAT) 0.4 MG SL tablet Place 1 tablet (0.4 mg total) under the tongue every 5 (five) minutes as needed.  25 tablet  3  . Omega-3 Fatty Acids (FISH OIL) 1000 MG CAPS Take 3 capsules by mouth daily.        . potassium chloride (K-DUR) 10 MEQ tablet TAKE 1 TABLET BY MOUTH EVERY DAY  30 tablet  2  . rosuvastatin (CRESTOR) 40 MG tablet Take 40 mg by mouth daily.      . vitamin B-12 (CYANOCOBALAMIN) 1000 MCG tablet Take 1,000 mcg by mouth  daily. With folic acid 400 mcg        No current facility-administered medications for this visit.    Past Medical History  Diagnosis Date  . Hyperlipidemia   . Coronary artery disease     s/p NSTEMI 09/2009; s/p CABG 09/2009 [place], [place]);echo 12/12/2009: EF 55-60%, normal RVF  . Pleural effusion     post CABG  . COPD (chronic obstructive pulmonary disease)   . RA (rheumatoid arthritis)   . Pulmonary nodules   . Acute appendicitis 09/17/2011    Past Surgical History  Procedure Laterality Date  . C-sections      x2  . Arthroscopic knee surgery    . Stapedectomy    . Coronary artery bypass graft      LIMA to the LAD, SVG right coronary artery, SVG to obtuse marginal September 2011   . Laparoscopic appendectomy  09/16/2011    Procedure: APPENDECTOMY LAPAROSCOPIC;  Surgeon: Liz Malady, MD;  Location: [facility] OR;  Service: General;  Laterality: N/A;  . Appendectomy      ROS:  As stated in the  HPI and negative for all other systems.  PHYSICAL EXAM BP 144/96  Pulse 80  Ht 5\' 9"  (1.753 m)  Wt 214 lb (97.07 kg)  BMI 31.59 kg/m2 GENERAL:  Well appearing NECK:  No jugular venous distention, waveform within normal limits, carotid upstroke brisk and symmetric, no bruits, no thyromegaly LYMPHATICS:  No cervical, inguinal adenopathy LUNGS:  Clear to auscultation bilaterally CHEST:  Well healed sternotomy scar. HEART:  PMI not displaced or sustained,S1 and S2 within normal limits, no S3, no S4, no clicks, no rubs, no murmurs ABD:  Flat, positive bowel sounds normal in frequency in pitch, no bruits, no rebound, no guarding, no midline pulsatile mass, no hepatomegaly, no splenomegaly EXT:  2 plus pulses throughout, no edema, no cyanosis no clubbing  EKG:   Sinus rhythm, rate 80, incomplete right bundle branch block , no acute ST-T wave changes.  11/14/2012   ASSESSMENT AND PLAN   CORONARY ARTERY BYPASS GRAFT, HX OF -  The patient has no new sypmtoms. No further cardiovascular  testing is indicated. We will continue with aggressive risk reduction and meds as listed.    HTN (hypertension) -  Although her blood pressure is slightly elevated today she says it's well controlled at home. She will continue on the meds as listed.  She will continue with weight loss   HYPERCHOLESTEROLEMIA -  The patient wants to be on the lowest dose possible her completely off of statin.. She is going to use a nutraceutical. We had a long discussion about this in the office. I reviewed her lipids from Dr. Miguel Rota office.  I reviewed with her the proven benefit of statins and I discussed her treatment with Reliv a dietary supplement.  I researched this item.  She will have her lipids followed by [facility], MD   (Greater than 25 minutes reviewing all data with greater than 50% face to face with the patient).

## 2012-11-14 NOTE — Patient Instructions (Signed)
The current medical regimen is effective;  continue present plan and medications.  Follow up in 1 year with Dr Antoine Poche.  You will receive a letter in the mail 2 months before you are due.  Please call us when you receive this letter to schedule your follow up appointment.  Fat and Cholesterol Control Diet Cholesterol levels in your body are determined significantly by your diet. Cholesterol levels may also be related to heart disease. The following material helps to explain this relationship and discusses what you can do to help keep your heart healthy. Not all cholesterol is bad. Low-density lipoprotein (LDL) cholesterol is the "bad" cholesterol. It may cause fatty deposits to build up inside your arteries. High-density lipoprotein (HDL) cholesterol is "good." It helps to remove the "bad" LDL cholesterol from your blood. Cholesterol is a very important risk factor for heart disease. Other risk factors are high blood pressure, smoking, stress, heredity, and weight. The heart muscle gets its supply of blood through the coronary arteries. If your LDL cholesterol is high and your HDL cholesterol is low, you are at risk for having fatty deposits build up in your coronary arteries. This leaves less room through which blood can flow. Without sufficient blood and oxygen, the heart muscle cannot function properly and you may feel chest pains (angina pectoris). When a coronary artery closes up entirely, a part of the heart muscle may die causing a heart attack (myocardial infarction). CHECKING CHOLESTEROL When your caregiver sends your blood to a lab to be examined for cholesterol, a complete lipid (fat) profile may be done. With this test, the total amount of cholesterol and levels of LDL and HDL are determined. Triglycerides are a type of fat that circulates in the blood. They can also be used to determine heart disease risk. The list below describes what the numbers should be: Test: Total Cholesterol.  Less  than 200 mg/dl. Test: LDL "bad cholesterol."  Less than 100 mg/dl.  Less than 70 mg/dl if you are at very high risk of a heart attack or sudden cardiac death. Test: HDL "good cholesterol."  Greater than 50 mg/dl for women.  Greater than 40 mg/dl for men. Test: Triglycerides.  Less than 150 mg/dl. CONTROLLING CHOLESTEROL WITH DIET Although exercise and lifestyle factors are important, your diet is key. That is because certain foods are known to raise cholesterol and others to lower it. The goal is to balance foods for their effect on cholesterol and more importantly, to replace saturated and trans fat with other types of fat, such as monounsaturated fat, polyunsaturated fat, and omega-3 fatty acids. On average, a person should consume no more than 15 to 17 g of saturated fat daily. Saturated and trans fats are considered "bad" fats, and they will raise LDL cholesterol. Saturated fats are primarily found in animal products such as meats, butter, and cream. However, that does not mean you need to give up all your favorite foods. Today, there are good tasting, low-fat, low-cholesterol substitutes for most of the things you like to eat. Choose low-fat or nonfat alternatives. Choose round or loin cuts of red meat. These types of cuts are lowest in fat and cholesterol. Chicken (without the skin), fish, veal, and ground [place] breast are great choices. Eliminate fatty meats, such as hot dogs and salami. Even shellfish have little or no saturated fat. Have a 3 oz (85 g) portion when you eat lean meat, poultry, or fish. Trans fats are also called "partially hydrogenated oils." They are oils  that have been scientifically manipulated so that they are solid at room temperature resulting in a longer shelf life and improved taste and texture of foods in which they are added. Trans fats are found in stick margarine, some tub margarines, cookies, crackers, and baked goods.  When baking and cooking, oils are a great  substitute for butter. The monounsaturated oils are especially beneficial since it is believed they lower LDL and raise HDL. The oils you should avoid entirely are saturated tropical oils, such as coconut and palm.  Remember to eat a lot from food groups that are naturally free of saturated and trans fat, including fish, fruit, vegetables, beans, grains (barley, rice, couscous, bulgur wheat), and pasta (without cream sauces).  IDENTIFYING FOODS THAT LOWER CHOLESTEROL  Soluble fiber may lower your cholesterol. This type of fiber is found in fruits such as apples, vegetables such as broccoli, potatoes, and carrots, legumes such as beans, peas, and lentils, and grains such as barley. Foods fortified with plant sterols (phytosterol) may also lower cholesterol. You should eat at least 2 g per day of these foods for a cholesterol lowering effect.  Read package labels to identify low-saturated fats, trans fat free, and low-fat foods at the supermarket. Select cheeses that have only 2 to 3 g saturated fat per ounce. Use a heart-healthy tub margarine that is free of trans fats or partially hydrogenated oil. When buying baked goods (cookies, crackers), avoid partially hydrogenated oils. Breads and muffins should be made from whole grains (whole-wheat or whole oat flour, instead of "flour" or "enriched flour"). Buy non-creamy canned soups with reduced salt and no added fats.  FOOD PREPARATION TECHNIQUES  Never deep-fry. If you must fry, either stir-fry, which uses very little fat, or use non-stick cooking sprays. When possible, broil, bake, or roast meats, and steam vegetables. Instead of putting butter or margarine on vegetables, use lemon and herbs, applesauce, and cinnamon (for squash and sweet potatoes), nonfat yogurt, salsa, and low-fat dressings for salads.  LOW-SATURATED FAT / LOW-FAT FOOD SUBSTITUTES Meats / Saturated Fat (g)  Avoid: Steak, marbled (3 oz/85 g) / 11 g  Choose: Steak, lean (3 oz/85 g) / 4  g  Avoid: Hamburger (3 oz/85 g) / 7 g  Choose: Hamburger, lean (3 oz/85 g) / 5 g  Avoid: Ham (3 oz/85 g) / 6 g  Choose: Ham, lean cut (3 oz/85 g) / 2.4 g  Avoid: Chicken, with skin, dark meat (3 oz/85 g) / 4 g  Choose: Chicken, skin removed, dark meat (3 oz/85 g) / 2 g  Avoid: Chicken, with skin, light meat (3 oz/85 g) / 2.5 g  Choose: Chicken, skin removed, light meat (3 oz/85 g) / 1 g Dairy / Saturated Fat (g)  Avoid: Whole milk (1 cup) / 5 g  Choose: Low-fat milk, 2% (1 cup) / 3 g  Choose: Low-fat milk, 1% (1 cup) / 1.5 g  Choose: Skim milk (1 cup) / 0.3 g  Avoid: Hard cheese (1 oz/28 g) / 6 g  Choose: Skim milk cheese (1 oz/28 g) / 2 to 3 g  Avoid: Cottage cheese, 4% fat (1 cup) / 6.5 g  Choose: Low-fat cottage cheese, 1% fat (1 cup) / 1.5 g  Avoid: Ice cream (1 cup) / 9 g  Choose: Sherbet (1 cup) / 2.5 g  Choose: Nonfat frozen yogurt (1 cup) / 0.3 g  Choose: Frozen fruit bar / trace  Avoid: Whipped cream (1 tbs) / 3.5 g  Choose: Nondairy  whipped topping (1 tbs) / 1 g Condiments / Saturated Fat (g)  Avoid: Mayonnaise (1 tbs) / 2 g  Choose: Low-fat mayonnaise (1 tbs) / 1 g  Avoid: Butter (1 tbs) / 7 g  Choose: Extra light margarine (1 tbs) / 1 g  Avoid: Coconut oil (1 tbs) / 11.8 g  Choose: Olive oil (1 tbs) / 1.8 g  Choose: Corn oil (1 tbs) / 1.7 g  Choose: Safflower oil (1 tbs) / 1.2 g  Choose: Sunflower oil (1 tbs) / 1.4 g  Choose: Soybean oil (1 tbs) / 2.4 g  Choose: Canola oil (1 tbs) / 1 g Document Released: 01/11/2005 Document Revised: 04/05/2011 Document Reviewed: 07/02/2010 [employer] Patient Information 2014 [place], [place].

## 2012-11-17 ENCOUNTER — Ambulatory Visit
Admission: RE | Admit: 2012-11-17 | Discharge: 2012-11-17 | Disposition: A | Discharge status: Home / Self Care | Payer: BC Managed Care – PPO | Source: Ambulatory Visit | Attending: Family Medicine | Admitting: Family Medicine

## 2012-11-17 DIAGNOSIS — Z1231 Encounter for screening mammogram for malignant neoplasm of breast: Secondary | ICD-10-CM

## 2012-11-17 DIAGNOSIS — E2839 Other primary ovarian failure: Secondary | ICD-10-CM

## 2013-01-27 ENCOUNTER — Other Ambulatory Visit: Payer: Self-pay | Admitting: Cardiology

## 2013-02-21 ENCOUNTER — Other Ambulatory Visit: Payer: Self-pay | Admitting: Cardiology

## 2013-06-06 ENCOUNTER — Encounter: Payer: Self-pay | Admitting: Cardiology

## 2013-06-26 ENCOUNTER — Other Ambulatory Visit: Payer: Self-pay

## 2013-06-26 MED ORDER — POTASSIUM CHLORIDE CRYS ER 10 MEQ PO TBCR: EXTENDED_RELEASE_TABLET | ORAL | Status: DC

## 2013-12-04 ENCOUNTER — Other Ambulatory Visit: Payer: Self-pay | Admitting: Cardiology

## 2013-12-08 ENCOUNTER — Other Ambulatory Visit: Payer: Self-pay | Admitting: Cardiology

## 2014-01-03 ENCOUNTER — Other Ambulatory Visit: Payer: Self-pay | Admitting: Cardiology

## 2014-02-03 ENCOUNTER — Other Ambulatory Visit: Payer: Self-pay | Admitting: Cardiology

## 2014-02-04 ENCOUNTER — Telehealth: Payer: Self-pay | Admitting: Cardiology

## 2014-02-04 DIAGNOSIS — Z79899 Other long term (current) drug therapy: Secondary | ICD-10-CM

## 2014-02-04 DIAGNOSIS — E785 Hyperlipidemia, unspecified: Secondary | ICD-10-CM

## 2014-02-04 NOTE — Telephone Encounter (Signed)
Spoke w/ patient, she reports having to cancel appt last minute due to car trouble. She wanted to ensure she could get labwork drawn in advance of appt. I ordered BMP & lipids & instructed her to fast when she gets bloodwork, she will report to get labs done when her car is fixed.  Also asked her about rescheduling appt, she states she will call when she has car out of shop to make appt.

## 2014-02-04 NOTE — Telephone Encounter (Signed)
Pt called in wanting to know if she needs to have labs done when she comes in to see Dr. Percival Spanish for her yearly follow up. Please call  Thanks

## 2014-02-05 ENCOUNTER — Ambulatory Visit: Payer: BC Managed Care – PPO | Admitting: Cardiology

## 2014-02-27 ENCOUNTER — Other Ambulatory Visit: Payer: Self-pay | Admitting: Cardiology

## 2014-02-27 NOTE — Telephone Encounter (Signed)
Rx has been sent to the pharmacy electronically. ° °

## 2014-03-03 ENCOUNTER — Other Ambulatory Visit: Payer: Self-pay

## 2014-03-03 MED ORDER — POTASSIUM CHLORIDE CRYS ER 10 MEQ PO TBCR: EXTENDED_RELEASE_TABLET | ORAL | Status: DC

## 2014-03-10 ENCOUNTER — Other Ambulatory Visit: Payer: Self-pay | Admitting: Cardiology

## 2014-03-11 NOTE — Telephone Encounter (Signed)
NEEDS TO MAKR FOLLOW UP APPT FOR REFILLS

## 2014-06-13 ENCOUNTER — Telehealth: Payer: Self-pay | Admitting: Cardiology

## 2014-06-13 NOTE — Telephone Encounter (Signed)
Pt is calling to get some new lab orders put in for her . Please call her once those orders have been placed.   Thanks

## 2014-06-17 NOTE — Telephone Encounter (Signed)
Has this been taken care of?

## 2014-06-25 NOTE — Telephone Encounter (Signed)
Yes orders are in

## 2014-06-25 NOTE — Telephone Encounter (Signed)
So this encounter can be closed?

## 2014-06-29 ENCOUNTER — Other Ambulatory Visit: Payer: Self-pay | Admitting: Adult Health

## 2014-07-01 NOTE — Telephone Encounter (Signed)
Per Dr Hochrein 

## 2014-07-01 NOTE — Telephone Encounter (Signed)
We can fill with a couple of months worth so that she has time to make an appt.

## 2014-07-08 ENCOUNTER — Telehealth: Payer: Self-pay | Admitting: Cardiology

## 2014-07-08 NOTE — Telephone Encounter (Signed)
Pt has mobility issues - d/t recent knee surgery. Advised OK to wait on labwork, defer to Dr. Percival Spanish for recommended tests until after visit if mobility is an issue.

## 2014-07-08 NOTE — Telephone Encounter (Signed)
Pt wants to know if she needs lab work before she see Dr Percival Spanish on Thursday?

## 2014-07-11 ENCOUNTER — Ambulatory Visit (INDEPENDENT_AMBULATORY_CARE_PROVIDER_SITE_OTHER): Payer: Medicare PPO | Admitting: Cardiology

## 2014-07-11 ENCOUNTER — Encounter: Payer: Self-pay | Admitting: Cardiology

## 2014-07-11 VITALS — BP 154/88 | HR 76 | Ht 69.0 in | Wt 207.9 lb

## 2014-07-11 DIAGNOSIS — I10 Essential (primary) hypertension: Secondary | ICD-10-CM

## 2014-07-11 DIAGNOSIS — Z951 Presence of aortocoronary bypass graft: Secondary | ICD-10-CM | POA: Diagnosis not present

## 2014-07-11 MED ORDER — METOPROLOL TARTRATE 25 MG PO TABS: 12.5000 mg | ORAL_TABLET | Freq: Two times a day (BID) | ORAL | Status: DC

## 2014-07-11 MED ORDER — POTASSIUM CHLORIDE CRYS ER 10 MEQ PO TBCR: EXTENDED_RELEASE_TABLET | ORAL | Status: DC

## 2014-07-11 MED ORDER — ROSUVASTATIN CALCIUM 40 MG PO TABS: 40.0000 mg | ORAL_TABLET | Freq: Every day | ORAL | Status: DC

## 2014-07-11 MED ORDER — FUROSEMIDE 20 MG PO TABS: 20.0000 mg | ORAL_TABLET | ORAL | Status: DC | PRN

## 2014-07-11 NOTE — Progress Notes (Signed)
HPI Marilyn Hernandez presents for evaluation status post bypass.  She is doing well.  The patient denies any new symptoms such as chest discomfort, neck or arm discomfort. There has been no new shortness of breath, PND or orthopnea. There have been no reported palpitations, presyncope or syncope.  She  Is walking routinely. She has lost seven pounds.    Allergies  Allergen Reactions  . Naproxen Sodium     Current Outpatient Prescriptions  Medication Sig Dispense Refill  . aspirin 81 MG tablet Take 81 mg by mouth daily.      . Calcium Magnesium 500-100-500 LIQD Take by mouth.      . Cholecalciferol (VITAMIN D3) 2000 UNITS TABS Take 1 capsule by mouth daily.      . Coenzyme Q10 (CO Q 10) 100 MG CAPS Take by mouth 2 (two) times daily.      . folic acid (FOLVITE) 458 MCG tablet Take 400 mcg by mouth daily.      . furosemide (LASIX) 20 MG tablet TAKE 1 TABLET BY MOUTH AS NEEDED 30 tablet 0  . glucosamine-chondroitin 500-400 MG tablet Take 1 tablet by mouth daily.      . metoprolol tartrate (LOPRESSOR) 25 MG tablet TAKE 1/2 TABLET BY MOUTH TWICE A DAY *GENERIC FOR LOPRESSOR* 30 tablet 1  . NITROSTAT 0.4 MG SL tablet PLACE 1 TABLET UNDER TONGUE EVERY 5 MINUTES IF NEEDED FOR CHEST PAIN 25 tablet 1  . Omega-3 Fatty Acids (FISH OIL) 1000 MG CAPS Take 3 capsules by mouth daily.      . potassium chloride (KLOR-CON M10) 10 MEQ tablet TAKE 1 TABLET BY MOUTH EVERY DAY 30 tablet 2  . rosuvastatin (CRESTOR) 20 MG tablet Take 1 tablet (20 mg total) by mouth daily.    . vitamin B-12 (CYANOCOBALAMIN) 1000 MCG tablet Take 1,000 mcg by mouth daily. With folic acid 099 mcg     . [DISCONTINUED] potassium chloride (K-DUR) 10 MEQ tablet TAKE 1 TABLET BY MOUTH EVERY DAY 30 tablet 2   No current facility-administered medications for this visit.    Past Medical History  Diagnosis Date  . Hyperlipidemia   . Coronary artery disease     s/p NSTEMI 09/2009; s/p CABG 09/2009 [facility] [place]echo 12/12/2009:  EF 55-60%, normal RVF  . Pleural effusion     post CABG  . COPD (chronic obstructive pulmonary disease)   . RA (rheumatoid arthritis)   . Pulmonary nodules   . Acute appendicitis 09/17/2011    Past Surgical History  Procedure Laterality Date  . C-sections      x2  . Arthroscopic knee surgery    . Stapedectomy    . Coronary artery bypass graft      LIMA to the LAD, SVG right coronary artery, SVG to obtuse marginal September 2011   . Laparoscopic appendectomy  09/16/2011    Procedure: APPENDECTOMY LAPAROSCOPIC;  Surgeon: Zenovia Jarred, MD;  Location: [place];  Service: General;  Laterality: N/A;  . Appendectomy      ROS:  As stated in the HPI and negative for all other systems.  PHYSICAL EXAM BP 154/88 mmHg  Pulse 76  Ht 5\' 9"  (1.753 m)  Wt 207 lb 14.4 oz (94.303 kg)  BMI 30.69 kg/m2 GENERAL:  Well appearing NECK:  No jugular venous distention, waveform within normal limits, carotid upstroke brisk and symmetric, no bruits, no thyromegaly LYMPHATICS:  No cervical, inguinal adenopathy LUNGS:  Clear to auscultation bilaterally CHEST:  Well healed  sternotomy scar. HEART:  PMI not displaced or sustained,S1 and S2 within normal limits, no S3, no S4, no clicks, no rubs, no murmurs ABD:  Flat, positive bowel sounds normal in frequency in pitch, no bruits, no rebound, no guarding, no midline pulsatile mass, no hepatomegaly, no splenomegaly EXT:  2 plus pulses throughout, no edema, no cyanosis no clubbing  EKG:   Sinus rhythm, rate 76, incomplete right bundle branch block , no acute ST-T wave changes.  07/11/2014   ASSESSMENT AND PLAN   CORONARY ARTERY BYPASS GRAFT, HX OF -  The patient has no new sypmtoms. No further cardiovascular testing is indicated. We will continue with aggressive risk reduction and meds as listed.  I will consider exercise treadmill testing when I see her next year. This year she is having some knee problems and wouldn't be able to do this. She is now 5 years  status post bypass.   HTN (hypertension) -  Although her blood pressure is slightly elevated today she says it's well controlled at home. She will continue on the meds as listed.  She will continue with weight loss   HYPERCHOLESTEROLEMIA -  She reports that this is followed by [facility], MD.  She will continue with meds as listed.   PREOP - If the patient requires surgery in the next six months she will be at acceptable risk according to current guidelines.  She has no new symptoms since her CABG.

## 2014-07-11 NOTE — Patient Instructions (Addendum)
Your physician wants you to follow-up in: 1 Year. You will receive a reminder letter in the mail two months in advance. If you don't receive a letter, please call our office to schedule the follow-up appointment.  

## 2014-12-30 ENCOUNTER — Other Ambulatory Visit: Payer: Self-pay | Admitting: *Deleted

## 2014-12-30 MED ORDER — NITROGLYCERIN 0.4 MG SL SUBL: SUBLINGUAL_TABLET | SUBLINGUAL | Status: AC

## 2015-01-28 ENCOUNTER — Telehealth: Payer: Self-pay | Admitting: *Deleted

## 2015-01-28 NOTE — Telephone Encounter (Signed)
Dr Percival Spanish surgical clearance office note from 07/11/2014 was route via epic to Dr Kathryne Hitch.

## 2015-02-06 ENCOUNTER — Other Ambulatory Visit: Payer: Self-pay | Admitting: Family Medicine

## 2015-02-06 DIAGNOSIS — Z1231 Encounter for screening mammogram for malignant neoplasm of breast: Secondary | ICD-10-CM

## 2015-02-11 ENCOUNTER — Telehealth: Payer: Self-pay | Admitting: Cardiology

## 2015-02-11 NOTE — Telephone Encounter (Signed)
Pt is going to have a knee replacement on 03-19-15.Does she need to have an appt with Dr Marilyn Hernandez before her surgery?

## 2015-02-11 NOTE — Telephone Encounter (Signed)
Pt is going to have a knee replacement on 03-19-15.Does she need to have an appt with Dr Warren Lacy before her surgery?

## 2015-02-18 ENCOUNTER — Ambulatory Visit
Admission: RE | Admit: 2015-02-18 | Discharge: 2015-02-18 | Disposition: A | Discharge status: Home / Self Care | Payer: Medicare Other | Source: Ambulatory Visit | Attending: Family Medicine | Admitting: Family Medicine

## 2015-02-18 DIAGNOSIS — Z1231 Encounter for screening mammogram for malignant neoplasm of breast: Secondary | ICD-10-CM

## 2015-02-20 NOTE — Telephone Encounter (Signed)
No she would not need to see me.  She has been active.  She has had no symptoms since her bypass.  Therefore, based on ACC/AHA guidelines, the patient would be at acceptable risk for the planned procedure without further cardiovascular testing.

## 2015-02-21 NOTE — Telephone Encounter (Signed)
Spoke with pt, aware of dr hochrein's recommendations This note will be forwarded to [facility] wainer

## 2015-02-26 DIAGNOSIS — I82409 Acute embolism and thrombosis of unspecified deep veins of unspecified lower extremity: Secondary | ICD-10-CM

## 2015-02-26 HISTORY — DX: Acute embolism and thrombosis of unspecified deep veins of unspecified lower extremity: I82.409

## 2015-03-03 ENCOUNTER — Other Ambulatory Visit: Payer: Self-pay | Admitting: Physician Assistant

## 2015-03-03 NOTE — H&P (Signed)
TOTAL KNEE ADMISSION H&P  Patient is being admitted for right total knee arthroplasty.  Subjective:  Chief Complaint:right knee pain.  HPI: Marilyn Hernandez, 65 y.o. female, has a history of pain and functional disability in the right knee due to arthritis and has failed non-surgical conservative treatments for greater than 12 weeks to includeNSAID's and/or analgesics and corticosteriod injections.  Onset of symptoms was gradual, starting 7 years ago with gradually worsening course since that time. The patient noted prior procedures on the knee to include  arthroscopy and menisectomy on the right knee(s).  Patient currently rates pain in the right knee(s) at 9 out of 10 with activity. Patient has night pain, worsening of pain with activity and weight bearing, pain that interferes with activities of daily living, pain with passive range of motion and crepitus.  Patient has evidence of subchondral sclerosis and joint space narrowing by imaging studies. There is no active infection.  Patient Active Problem List   Diagnosis Date Noted  . S/P laparoscopic appendectomy 10/06/2011  . Acute appendicitis 09/17/2011  . HTN (hypertension) 06/05/2010  . SYNCOPE, HX OF 12/23/2009  . Rheumatoid arthritis(714.0) 12/17/2009  . PLEURAL EFFUSION, LEFT 11/21/2009  . CHEST PAIN-PAINFUL RESPIRATION 11/21/2009  . HYPERCHOLESTEROLEMIA 11/03/2009  . CORONARY ATHEROSCLEROSIS NATIVE CORONARY ARTERY 11/03/2009  . FIBRILLATION, ATRIAL 11/03/2009  . CORONARY ARTERY BYPASS GRAFT, HX OF 11/03/2009   Past Medical History  Diagnosis Date  . Hyperlipidemia   . Coronary artery disease     s/p NSTEMI 09/2009; s/p CABG 09/2009 [facility] [place]);echo 12/12/2009: EF 55-60%, normal RVF  . Pleural effusion     post CABG  . COPD (chronic obstructive pulmonary disease)   . RA (rheumatoid arthritis)   . Pulmonary nodules   . Acute appendicitis 09/17/2011    Past Surgical History  Procedure Laterality Date  . C-sections      x2  . Arthroscopic knee surgery    . Stapedectomy    . Coronary artery bypass graft      LIMA to the LAD, SVG right coronary artery, SVG to obtuse marginal September 2011   . Laparoscopic appendectomy  09/16/2011    Procedure: APPENDECTOMY LAPAROSCOPIC;  Surgeon: Zenovia Jarred, MD;  Location: [place];  Service: General;  Laterality: N/A;  . Appendectomy       (Not in a hospital admission) Allergies  Allergen Reactions  . Naproxen Sodium     Social History  Substance Use Topics  . Smoking status: Never Smoker   . Smokeless tobacco: Never Used  . Alcohol Use: No    Family History  Problem Relation Age of Onset  . Heart disease Mother     bypass surgery in her 50s  . Arrhythmia Brother     atrial fibrillation     Review of Systems  Constitutional: Negative.   HENT: Negative.   Eyes: Negative.   Respiratory: Negative.   Cardiovascular: Negative.   Gastrointestinal: Negative.   Genitourinary: Negative.   Musculoskeletal: Positive for joint pain.  Skin: Negative.   Neurological: Negative.   Endo/Heme/Allergies: Negative.   Psychiatric/Behavioral: Negative.     Objective:  Physical Exam  Constitutional: She is oriented to person, place, and time. She appears well-developed and well-nourished.  HENT:  Head: Normocephalic and atraumatic.  Eyes: EOM are normal. Pupils are equal, round, and reactive to light.  Neck: Normal range of motion. Neck supple.  Cardiovascular: Normal rate and regular rhythm.   Respiratory: Effort normal and breath sounds normal.  GI: Soft.  Bowel sounds are normal.  Musculoskeletal:  Examination of both knees reveal varus thrust on the right.  Range of motion 0-90 degrees.  Moderate patellofemoral crepitus.  Medial and lateral joint line tenderness.  She is neurovascularly intact distally.    Neurological: She is alert and oriented to person, place, and time.  Skin: Skin is warm and dry.  Psychiatric: She has a normal mood and affect. Her  behavior is normal. Judgment and thought content normal.    Vital signs in last 24 hours: @VSRANGES @  Labs:   Estimated body mass index is 30.69 kg/(m^2) as calculated from the following:   Height as of 07/11/14: 5\' 9"  (1.753 m).   Weight as of 07/11/14: 94.303 kg (207 lb 14.4 oz).   Imaging Review Plain radiographs demonstrate severe degenerative joint disease of the right knee(s). The overall alignment isneutral. The bone quality appears to be fair for age and reported activity level.  Assessment/Plan:  End stage arthritis, right knee   The patient history, physical examination, clinical judgment of the provider and imaging studies are consistent with end stage degenerative joint disease of the right knee(s) and total knee arthroplasty is deemed medically necessary. The treatment options including medical management, injection therapy arthroscopy and arthroplasty were discussed at length. The risks and benefits of total knee arthroplasty were presented and reviewed. The risks due to aseptic loosening, infection, stiffness, patella tracking problems, thromboembolic complications and other imponderables were discussed. The patient acknowledged the explanation, agreed to proceed with the plan and consent was signed. Patient is being admitted for inpatient treatment for surgery, pain control, PT, OT, prophylactic antibiotics, VTE prophylaxis, progressive ambulation and ADL's and discharge planning. The patient is planning to be discharged home with home health services

## 2015-03-07 ENCOUNTER — Encounter (HOSPITAL_COMMUNITY): Payer: Self-pay

## 2015-03-07 ENCOUNTER — Encounter (HOSPITAL_COMMUNITY)
Admission: RE | Admit: 2015-03-07 | Discharge: 2015-03-07 | Disposition: A | Discharge status: Home / Self Care | Payer: Medicare Other | Source: Ambulatory Visit | Attending: Orthopedic Surgery | Admitting: Orthopedic Surgery

## 2015-03-07 DIAGNOSIS — Z7982 Long term (current) use of aspirin: Secondary | ICD-10-CM | POA: Diagnosis not present

## 2015-03-07 DIAGNOSIS — K219 Gastro-esophageal reflux disease without esophagitis: Secondary | ICD-10-CM | POA: Diagnosis not present

## 2015-03-07 DIAGNOSIS — Z01812 Encounter for preprocedural laboratory examination: Secondary | ICD-10-CM | POA: Insufficient documentation

## 2015-03-07 DIAGNOSIS — I252 Old myocardial infarction: Secondary | ICD-10-CM | POA: Diagnosis not present

## 2015-03-07 DIAGNOSIS — Z79899 Other long term (current) drug therapy: Secondary | ICD-10-CM | POA: Diagnosis not present

## 2015-03-07 DIAGNOSIS — I2510 Atherosclerotic heart disease of native coronary artery without angina pectoris: Secondary | ICD-10-CM | POA: Insufficient documentation

## 2015-03-07 DIAGNOSIS — Z951 Presence of aortocoronary bypass graft: Secondary | ICD-10-CM | POA: Diagnosis not present

## 2015-03-07 DIAGNOSIS — J449 Chronic obstructive pulmonary disease, unspecified: Secondary | ICD-10-CM | POA: Diagnosis not present

## 2015-03-07 DIAGNOSIS — Z0183 Encounter for blood typing: Secondary | ICD-10-CM | POA: Diagnosis not present

## 2015-03-07 DIAGNOSIS — I10 Essential (primary) hypertension: Secondary | ICD-10-CM | POA: Diagnosis not present

## 2015-03-07 DIAGNOSIS — E785 Hyperlipidemia, unspecified: Secondary | ICD-10-CM | POA: Insufficient documentation

## 2015-03-07 DIAGNOSIS — M1711 Unilateral primary osteoarthritis, right knee: Secondary | ICD-10-CM | POA: Diagnosis not present

## 2015-03-07 DIAGNOSIS — Z01818 Encounter for other preprocedural examination: Secondary | ICD-10-CM | POA: Diagnosis present

## 2015-03-07 HISTORY — DX: Acute upper respiratory infection, unspecified: J06.9

## 2015-03-07 HISTORY — DX: Essential (primary) hypertension: I10

## 2015-03-07 LAB — SURGICAL PCR SCREEN
MRSA, PCR: NEGATIVE
STAPHYLOCOCCUS AUREUS: NEGATIVE

## 2015-03-07 LAB — CBC WITH DIFFERENTIAL/PLATELET
Basophils Absolute: 0 10*3/uL (ref 0.0–0.1)
Basophils Relative: 0 %
EOS ABS: 0.2 10*3/uL (ref 0.0–0.7)
EOS PCT: 2 %
HCT: 41.7 % (ref 36.0–46.0)
HEMOGLOBIN: 13.3 g/dL (ref 12.0–15.0)
LYMPHS PCT: 26 %
Lymphs Abs: 2.5 10*3/uL (ref 0.7–4.0)
MCH: 27.1 pg (ref 26.0–34.0)
MCHC: 31.9 g/dL (ref 30.0–36.0)
MCV: 85.1 fL (ref 78.0–100.0)
Monocytes Absolute: 0.6 10*3/uL (ref 0.1–1.0)
Monocytes Relative: 6 %
Neutro Abs: 6.2 10*3/uL (ref 1.7–7.7)
Neutrophils Relative %: 66 %
Platelets: 315 10*3/uL (ref 150–400)
RBC: 4.9 MIL/uL (ref 3.87–5.11)
RDW: 15.7 % — ABNORMAL HIGH (ref 11.5–15.5)
WBC: 9.5 10*3/uL (ref 4.0–10.5)

## 2015-03-07 LAB — COMPREHENSIVE METABOLIC PANEL
ALK PHOS: 91 U/L (ref 38–126)
ALT: 13 U/L — AB (ref 14–54)
ANION GAP: 11 (ref 5–15)
AST: 20 U/L (ref 15–41)
Albumin: 3.4 g/dL — ABNORMAL LOW (ref 3.5–5.0)
BUN: 16 mg/dL (ref 6–20)
CALCIUM: 9.6 mg/dL (ref 8.9–10.3)
CO2: 24 mmol/L (ref 22–32)
CREATININE: 0.95 mg/dL (ref 0.44–1.00)
Chloride: 107 mmol/L (ref 101–111)
Glucose, Bld: 100 mg/dL — ABNORMAL HIGH (ref 65–99)
Potassium: 4.1 mmol/L (ref 3.5–5.1)
SODIUM: 142 mmol/L (ref 135–145)
TOTAL PROTEIN: 7.3 g/dL (ref 6.5–8.1)
Total Bilirubin: 0.3 mg/dL (ref 0.3–1.2)

## 2015-03-07 LAB — PROTIME-INR
INR: 1.03 (ref 0.00–1.49)
PROTHROMBIN TIME: 13.7 s (ref 11.6–15.2)

## 2015-03-07 LAB — TYPE AND SCREEN
ABO/RH(D): O POS
Antibody Screen: NEGATIVE

## 2015-03-07 LAB — APTT: APTT: 36 s (ref 24–37)

## 2015-03-07 LAB — ABO/RH: ABO/RH(D): O POS

## 2015-03-07 NOTE — Pre-Procedure Instructions (Addendum)
DICKIE DEVALLE  03/07/2015      [facility] - [place], [place] - [place] [address] Phone: [phone] Fax: [phone]  [id number] [place], [place] [address] [place] [address] Phone: [phone] Fax: [phone]    Your procedure is scheduled on 03/19/15.  Report to [facility] Admitting at 800 A.M.  Call this number if you have problems the morning of surgery:  [phone]   Remember:  Do not eat food or drink liquids after midnight.  Take these medicines the morning of surgery with A SIP OF WATER metropolol, nitro if needed  STOP all herbel meds, nsaids (aleve,naproxen,advil,ibuprofen) 5 days prior to surgery starting 03/14/15 including aspirin, fish oil, vit D, vit B,coq10,folic acid, glucosamine-chondrotin   Do not wear jewelry, make-up or nail polish.  Do not wear lotions, powders, or perfumes.  You may wear deodorant.  Do not shave 48 hours prior to surgery.  Men may shave face and neck.  Do not bring valuables to the hospital.  [facility] is not responsible for any belongings or valuables.  Contacts, dentures or bridgework may not be worn into surgery.  Leave your suitcase in the car.  After surgery it may be brought to your room.  For patients admitted to the hospital, discharge time will be determined by your treatment team.  Patients discharged the day of surgery will not be allowed to drive home.   Name and phone number of your driver:  Special instructions:   Special Instructions: [place] - Preparing for Surgery  Before surgery, you can play an important role.  Because skin is not sterile, your skin needs to be as free of germs as possible.  You can reduce the number of germs on you skin by washing with CHG (chlorahexidine gluconate) soap before surgery.  CHG is an antiseptic cleaner which kills germs and bonds with the  skin to continue killing germs even after washing.  Please DO NOT use if you have an allergy to CHG or antibacterial soaps.  If your skin becomes reddened/irritated stop using the CHG and inform your nurse when you arrive at Short Stay.  Do not shave (including legs and underarms) for at least 48 hours prior to the first CHG shower.  You may shave your face.  Please follow these instructions carefully:   1.  Shower with CHG Soap the night before surgery and the morning of Surgery.  2.  If you choose to wash your hair, wash your hair first as usual with your normal shampoo.  3.  After you shampoo, rinse your hair and body thoroughly to remove the Shampoo.  4.  Use CHG as you would any other liquid soap.  You can apply chg directly  to the skin and wash gently with scrungie or a clean washcloth.  5.  Apply the CHG Soap to your body ONLY FROM THE NECK DOWN.  Do not use on open wounds or open sores.  Avoid contact with your eyes ears, mouth and genitals (private parts).  Wash genitals (private parts)       with your normal soap.  6.  Wash thoroughly, paying special attention to the area where your surgery will be performed.  7.  Thoroughly rinse your body with warm water from the neck down.  8.  DO NOT shower/wash with your normal soap after  using and rinsing off the CHG Soap.  9.  Pat yourself dry with a clean towel.            10.  Wear clean pajamas.            11.  Place clean sheets on your bed the night of your first shower and do not sleep with pets.  Day of Surgery  Do not apply any lotions/deodorants the morning of surgery.  Please wear clean clothes to the hospital/surgery center.  Please read over the following fact sheets that you were given. Pain Booklet, Coughing and Deep Breathing, Blood Transfusion Information, Total Joint Packet, MRSA Information and Surgical Site Infection Prevention

## 2015-03-09 LAB — URINE CULTURE

## 2015-03-11 NOTE — Progress Notes (Signed)
Anesthesia Chart Review:  Pt is a 65 year old female scheduled for R total knee arthroplasty on 03/19/2015 with Dr. Maryla Morrow.   Cardiologist is Dr. Minus Breeding.   PMH includes:  CAD (s/p NSTEMI 09/2009; s/p CABG 09/2009: LIMA to LAD, SVG to RCA and SVG to OM ([place], [place] HTN, hyperlipidemia, COPD, pulmonary nodules, GERD. Never smoker. BMI 32. S/p appendectomy 09/16/11.   Medications include: ASA, lasix, metoprolol, potassium, rosuvastatin  Preoperative labs reviewed.  Urine culture demonstrates UTI. Notified Sheri in Dr. Debroah Loop office.   EKG 07/11/14: Sinus rhythm. Incomplete RBBB.   Echo 12/12/09:  - Left ventricle: No definite wall motion abnormalities though endocardium is difficult to see in all views. The cavity size was normal. Wall thickness was normal. Systolic function was normal. The estimated ejection fraction was in the range of 55% to 60%.  Pt has cardiac clearance for surgery from Dr. Percival Spanish in [place] telephone encounter dated 02/20/15.  If no changes, I anticipate pt can proceed with surgery as scheduled.   Willeen Cass, FNP-BC [facility] Short Stay Surgical Center/Anesthesiology Phone: [phone] 03/11/2015 4:11 PM

## 2015-03-18 MED ORDER — LACTATED RINGERS IV SOLN
INTRAVENOUS | Status: DC
Start: 2015-03-19 — End: 2015-03-19
  Administered 2015-03-19: 10:00:00 via INTRAVENOUS

## 2015-03-18 MED ORDER — CEFAZOLIN SODIUM-DEXTROSE 2-3 GM-% IV SOLR
2.0000 g | INTRAVENOUS | Status: AC
  Administered 2015-03-19: 2 g via INTRAVENOUS
  Filled 2015-03-18: qty 50

## 2015-03-19 ENCOUNTER — Encounter
Admission: RE | Admit: 2015-03-19 | Discharge: 2015-03-19 | Disposition: A | Discharge status: Home / Self Care | Payer: Medicare Other | Source: Ambulatory Visit | Attending: Internal Medicine | Admitting: Internal Medicine

## 2015-03-19 ENCOUNTER — Inpatient Hospital Stay (HOSPITAL_COMMUNITY): Payer: Medicare Other

## 2015-03-19 ENCOUNTER — Inpatient Hospital Stay (HOSPITAL_COMMUNITY): Payer: Medicare Other | Admitting: Certified Registered"

## 2015-03-19 ENCOUNTER — Encounter (HOSPITAL_COMMUNITY): Payer: Self-pay | Admitting: Certified Registered"

## 2015-03-19 ENCOUNTER — Encounter (HOSPITAL_COMMUNITY)
Admission: RE | Disposition: A | Discharge status: Skilled Nursing Facility | Payer: Self-pay | Source: Ambulatory Visit | Attending: Orthopedic Surgery

## 2015-03-19 ENCOUNTER — Inpatient Hospital Stay (HOSPITAL_COMMUNITY)
Admission: RE | Admit: 2015-03-19 | Discharge: 2015-03-21 | DRG: 470 | Disposition: A | Discharge status: Skilled Nursing Facility | Payer: Medicare Other | Source: Ambulatory Visit | Attending: Orthopedic Surgery | Admitting: Orthopedic Surgery

## 2015-03-19 ENCOUNTER — Inpatient Hospital Stay (HOSPITAL_COMMUNITY): Payer: Medicare Other | Admitting: Emergency Medicine

## 2015-03-19 DIAGNOSIS — E785 Hyperlipidemia, unspecified: Secondary | ICD-10-CM | POA: Diagnosis present

## 2015-03-19 DIAGNOSIS — Z951 Presence of aortocoronary bypass graft: Secondary | ICD-10-CM | POA: Diagnosis not present

## 2015-03-19 DIAGNOSIS — R42 Dizziness and giddiness: Secondary | ICD-10-CM | POA: Diagnosis not present

## 2015-03-19 DIAGNOSIS — R112 Nausea with vomiting, unspecified: Secondary | ICD-10-CM | POA: Diagnosis not present

## 2015-03-19 DIAGNOSIS — I252 Old myocardial infarction: Secondary | ICD-10-CM

## 2015-03-19 DIAGNOSIS — J449 Chronic obstructive pulmonary disease, unspecified: Secondary | ICD-10-CM | POA: Diagnosis present

## 2015-03-19 DIAGNOSIS — Z9049 Acquired absence of other specified parts of digestive tract: Secondary | ICD-10-CM | POA: Diagnosis not present

## 2015-03-19 DIAGNOSIS — I2510 Atherosclerotic heart disease of native coronary artery without angina pectoris: Secondary | ICD-10-CM | POA: Diagnosis present

## 2015-03-19 DIAGNOSIS — M1711 Unilateral primary osteoarthritis, right knee: Principal | ICD-10-CM | POA: Diagnosis present

## 2015-03-19 DIAGNOSIS — Z6830 Body mass index (BMI) 30.0-30.9, adult: Secondary | ICD-10-CM

## 2015-03-19 DIAGNOSIS — M8580 Other specified disorders of bone density and structure, unspecified site: Secondary | ICD-10-CM | POA: Diagnosis present

## 2015-03-19 DIAGNOSIS — Z96659 Presence of unspecified artificial knee joint: Secondary | ICD-10-CM

## 2015-03-19 DIAGNOSIS — M069 Rheumatoid arthritis, unspecified: Secondary | ICD-10-CM | POA: Diagnosis present

## 2015-03-19 DIAGNOSIS — D62 Acute posthemorrhagic anemia: Secondary | ICD-10-CM | POA: Diagnosis not present

## 2015-03-19 DIAGNOSIS — Z888 Allergy status to other drugs, medicaments and biological substances status: Secondary | ICD-10-CM

## 2015-03-19 DIAGNOSIS — I10 Essential (primary) hypertension: Secondary | ICD-10-CM | POA: Diagnosis present

## 2015-03-19 DIAGNOSIS — K219 Gastro-esophageal reflux disease without esophagitis: Secondary | ICD-10-CM | POA: Diagnosis present

## 2015-03-19 DIAGNOSIS — E669 Obesity, unspecified: Secondary | ICD-10-CM | POA: Diagnosis present

## 2015-03-19 HISTORY — DX: Anemia, unspecified: D64.9

## 2015-03-19 HISTORY — PX: TOTAL KNEE ARTHROPLASTY: SHX125

## 2015-03-19 HISTORY — DX: Non-ST elevation (NSTEMI) myocardial infarction: I21.4

## 2015-03-19 HISTORY — DX: Other complications of anesthesia, initial encounter: T88.59XA

## 2015-03-19 HISTORY — DX: Personal history of other diseases of the musculoskeletal system and connective tissue: Z87.39

## 2015-03-19 HISTORY — DX: Adverse effect of unspecified anesthetic, initial encounter: T41.45XA

## 2015-03-19 HISTORY — DX: Unspecified malignant neoplasm of skin of unspecified eyelid, including canthus: C44.101

## 2015-03-19 SURGERY — ARTHROPLASTY, KNEE, TOTAL
Anesthesia: Spinal | Site: Knee | Laterality: Right

## 2015-03-19 MED ORDER — BUPIVACAINE LIPOSOME 1.3 % IJ SUSP
20.0000 mL | INTRAMUSCULAR | Status: AC
  Administered 2015-03-19: 20 mL
  Filled 2015-03-19: qty 20

## 2015-03-19 MED ORDER — ACETAMINOPHEN 325 MG PO TABS: 650.0000 mg | ORAL_TABLET | Freq: Four times a day (QID) | ORAL | Status: DC | PRN

## 2015-03-19 MED ORDER — PROPOFOL 10 MG/ML IV BOLUS
INTRAVENOUS | Status: DC | PRN
  Administered 2015-03-19: 20 mg via INTRAVENOUS

## 2015-03-19 MED ORDER — FUROSEMIDE 20 MG PO TABS: 20.0000 mg | ORAL_TABLET | Freq: Two times a day (BID) | ORAL | Status: DC | PRN

## 2015-03-19 MED ORDER — HYDROMORPHONE HCL 1 MG/ML IJ SOLN
INTRAMUSCULAR | Status: AC
  Administered 2015-03-19: 0.5 mg via INTRAVENOUS
  Filled 2015-03-19: qty 1

## 2015-03-19 MED ORDER — DEXAMETHASONE SODIUM PHOSPHATE 10 MG/ML IJ SOLN
10.0000 mg | Freq: Once | INTRAMUSCULAR | Status: AC
  Administered 2015-03-20: 10 mg via INTRAVENOUS
  Filled 2015-03-19: qty 1

## 2015-03-19 MED ORDER — POTASSIUM CHLORIDE CRYS ER 10 MEQ PO TBCR: 10.0000 meq | EXTENDED_RELEASE_TABLET | Freq: Every day | ORAL | Status: DC | PRN

## 2015-03-19 MED ORDER — OXYCODONE HCL 5 MG PO TABS
5.0000 mg | ORAL_TABLET | ORAL | Status: DC | PRN
  Administered 2015-03-19 – 2015-03-21 (×10): 10 mg via ORAL
  Filled 2015-03-19 (×10): qty 2

## 2015-03-19 MED ORDER — CEFAZOLIN SODIUM-DEXTROSE 2-3 GM-% IV SOLR
2.0000 g | Freq: Four times a day (QID) | INTRAVENOUS | Status: AC
  Administered 2015-03-19 (×2): 2 g via INTRAVENOUS
  Filled 2015-03-19 (×2): qty 50

## 2015-03-19 MED ORDER — ROSUVASTATIN CALCIUM 10 MG PO TABS
40.0000 mg | ORAL_TABLET | Freq: Every day | ORAL | Status: DC
  Administered 2015-03-19 – 2015-03-20 (×2): 40 mg via ORAL
  Filled 2015-03-19 (×2): qty 4
  Filled 2015-03-19: qty 8
  Filled 2015-03-19: qty 4

## 2015-03-19 MED ORDER — ONDANSETRON HCL 4 MG/2ML IJ SOLN
4.0000 mg | Freq: Four times a day (QID) | INTRAMUSCULAR | Status: DC | PRN
  Administered 2015-03-19: 4 mg via INTRAVENOUS
  Filled 2015-03-19: qty 2

## 2015-03-19 MED ORDER — MIDAZOLAM HCL 2 MG/2ML IJ SOLN
INTRAMUSCULAR | Status: AC
  Filled 2015-03-19: qty 2

## 2015-03-19 MED ORDER — FENTANYL CITRATE (PF) 250 MCG/5ML IJ SOLN
INTRAMUSCULAR | Status: AC
  Filled 2015-03-19: qty 5

## 2015-03-19 MED ORDER — HYDROMORPHONE HCL 1 MG/ML IJ SOLN
0.5000 mg | INTRAMUSCULAR | Status: DC | PRN
  Administered 2015-03-19 (×2): 0.5 mg via INTRAVENOUS

## 2015-03-19 MED ORDER — MIDAZOLAM HCL 2 MG/2ML IJ SOLN
0.5000 mg | Freq: Once | INTRAMUSCULAR | Status: AC | PRN
  Administered 2015-03-19: 1 mg via INTRAVENOUS

## 2015-03-19 MED ORDER — DIPHENHYDRAMINE HCL 12.5 MG/5ML PO ELIX
12.5000 mg | ORAL_SOLUTION | ORAL | Status: DC | PRN
Start: 2015-03-19 — End: 2015-03-21

## 2015-03-19 MED ORDER — NITROGLYCERIN 0.4 MG SL SUBL: 0.4000 mg | SUBLINGUAL_TABLET | SUBLINGUAL | Status: DC | PRN

## 2015-03-19 MED ORDER — 0.9 % SODIUM CHLORIDE (POUR BTL) OPTIME
TOPICAL | Status: DC | PRN
  Administered 2015-03-19: 1000 mL

## 2015-03-19 MED ORDER — SODIUM CHLORIDE 0.9 % IR SOLN
Status: DC | PRN
  Administered 2015-03-19: 3000 mL

## 2015-03-19 MED ORDER — BISACODYL 10 MG RE SUPP: 10.0000 mg | Freq: Every day | RECTAL | Status: DC | PRN

## 2015-03-19 MED ORDER — PROPOFOL 10 MG/ML IV BOLUS
INTRAVENOUS | Status: AC
  Filled 2015-03-19: qty 20

## 2015-03-19 MED ORDER — METOCLOPRAMIDE HCL 5 MG/ML IJ SOLN
5.0000 mg | Freq: Three times a day (TID) | INTRAMUSCULAR | Status: DC | PRN
  Administered 2015-03-20: 10 mg via INTRAVENOUS
  Filled 2015-03-19: qty 2

## 2015-03-19 MED ORDER — ACETAMINOPHEN 650 MG RE SUPP: 650.0000 mg | Freq: Four times a day (QID) | RECTAL | Status: DC | PRN

## 2015-03-19 MED ORDER — DOCUSATE SODIUM 100 MG PO CAPS
100.0000 mg | ORAL_CAPSULE | Freq: Two times a day (BID) | ORAL | Status: DC
  Administered 2015-03-19 – 2015-03-21 (×4): 100 mg via ORAL
  Filled 2015-03-19 (×4): qty 1

## 2015-03-19 MED ORDER — PROMETHAZINE HCL 25 MG/ML IJ SOLN
INTRAMUSCULAR | Status: AC
  Filled 2015-03-19: qty 1

## 2015-03-19 MED ORDER — FOLIC ACID 800 MCG PO TABS: 400.0000 ug | ORAL_TABLET | Freq: Every day | ORAL | Status: DC

## 2015-03-19 MED ORDER — HYDROMORPHONE HCL 1 MG/ML IJ SOLN
0.5000 mg | INTRAMUSCULAR | Status: DC | PRN
  Administered 2015-03-19 – 2015-03-20 (×5): 1 mg via INTRAVENOUS
  Filled 2015-03-19 (×6): qty 1

## 2015-03-19 MED ORDER — HYDROMORPHONE HCL 1 MG/ML IJ SOLN
INTRAMUSCULAR | Status: AC
  Administered 2015-03-19: 0.5 mg via INTRAVENOUS
  Filled 2015-03-19: qty 2

## 2015-03-19 MED ORDER — POTASSIUM CHLORIDE IN NACL 20-0.9 MEQ/L-% IV SOLN
INTRAVENOUS | Status: DC
  Administered 2015-03-19: 16:00:00 via INTRAVENOUS
  Filled 2015-03-19: qty 1000

## 2015-03-19 MED ORDER — BISACODYL 5 MG PO TBEC: 5.0000 mg | DELAYED_RELEASE_TABLET | Freq: Every day | ORAL | Status: DC | PRN

## 2015-03-19 MED ORDER — BUPIVACAINE IN DEXTROSE 0.75-8.25 % IT SOLN
INTRATHECAL | Status: DC | PRN
  Administered 2015-03-19: 15 mg via INTRATHECAL

## 2015-03-19 MED ORDER — APIXABAN 2.5 MG PO TABS
2.5000 mg | ORAL_TABLET | Freq: Two times a day (BID) | ORAL | Status: DC
  Administered 2015-03-20 – 2015-03-21 (×3): 2.5 mg via ORAL
  Filled 2015-03-19 (×3): qty 1

## 2015-03-19 MED ORDER — ALUM & MAG HYDROXIDE-SIMETH 200-200-20 MG/5ML PO SUSP: 30.0000 mL | ORAL | Status: DC | PRN

## 2015-03-19 MED ORDER — PHENYLEPHRINE HCL 10 MG/ML IJ SOLN
INTRAMUSCULAR | Status: DC | PRN
  Administered 2015-03-19 (×2): 80 ug via INTRAVENOUS

## 2015-03-19 MED ORDER — VITAMIN B-12 1000 MCG PO TABS
1000.0000 ug | ORAL_TABLET | Freq: Every day | ORAL | Status: DC
  Administered 2015-03-20 – 2015-03-21 (×2): 1000 ug via ORAL
  Filled 2015-03-19 (×2): qty 1

## 2015-03-19 MED ORDER — BUPIVACAINE HCL (PF) 0.5 % IJ SOLN
INTRAMUSCULAR | Status: AC
  Filled 2015-03-19: qty 10

## 2015-03-19 MED ORDER — BUPIVACAINE HCL 0.5 % IJ SOLN
INTRAMUSCULAR | Status: DC | PRN
  Administered 2015-03-19: 10 mL

## 2015-03-19 MED ORDER — CHLORHEXIDINE GLUCONATE 4 % EX LIQD: 60.0000 mL | Freq: Once | CUTANEOUS | Status: DC

## 2015-03-19 MED ORDER — APIXABAN 2.5 MG PO TABS: ORAL_TABLET | ORAL | Status: DC

## 2015-03-19 MED ORDER — METOPROLOL TARTRATE 12.5 MG HALF TABLET
12.5000 mg | ORAL_TABLET | Freq: Two times a day (BID) | ORAL | Status: DC
  Administered 2015-03-19 – 2015-03-21 (×4): 12.5 mg via ORAL
  Filled 2015-03-19 (×4): qty 1

## 2015-03-19 MED ORDER — HYDROMORPHONE HCL 1 MG/ML IJ SOLN
0.5000 mg | INTRAMUSCULAR | Status: DC | PRN
  Administered 2015-03-19: 0.5 mg via INTRAVENOUS

## 2015-03-19 MED ORDER — OXYCODONE-ACETAMINOPHEN 5-325 MG PO TABS: 1.0000 | ORAL_TABLET | ORAL | Status: DC | PRN

## 2015-03-19 MED ORDER — MENTHOL 3 MG MT LOZG: 1.0000 | LOZENGE | OROMUCOSAL | Status: DC | PRN

## 2015-03-19 MED ORDER — FLEET ENEMA 7-19 GM/118ML RE ENEM: 1.0000 | ENEMA | Freq: Once | RECTAL | Status: DC | PRN

## 2015-03-19 MED ORDER — LIDOCAINE HCL (CARDIAC) 20 MG/ML IV SOLN
INTRAVENOUS | Status: DC | PRN
  Administered 2015-03-19: 40 mg via INTRATRACHEAL

## 2015-03-19 MED ORDER — PHENOL 1.4 % MT LIQD: 1.0000 | OROMUCOSAL | Status: DC | PRN

## 2015-03-19 MED ORDER — HYDROMORPHONE HCL 1 MG/ML IJ SOLN
0.2500 mg | INTRAMUSCULAR | Status: DC | PRN
  Administered 2015-03-19 (×4): 0.5 mg via INTRAVENOUS

## 2015-03-19 MED ORDER — MIDAZOLAM HCL 5 MG/5ML IJ SOLN
INTRAMUSCULAR | Status: DC | PRN
  Administered 2015-03-19: 2 mg via INTRAVENOUS

## 2015-03-19 MED ORDER — LIDOCAINE HCL (CARDIAC) 20 MG/ML IV SOLN
INTRAVENOUS | Status: AC
  Filled 2015-03-19: qty 5

## 2015-03-19 MED ORDER — FOLIC ACID 1 MG PO TABS
0.5000 mg | ORAL_TABLET | Freq: Every day | ORAL | Status: DC
  Administered 2015-03-20 – 2015-03-21 (×2): 0.5 mg via ORAL
  Filled 2015-03-19 (×2): qty 1

## 2015-03-19 MED ORDER — ONDANSETRON HCL 4 MG PO TABS
4.0000 mg | ORAL_TABLET | Freq: Four times a day (QID) | ORAL | Status: DC | PRN
  Administered 2015-03-20 (×2): 4 mg via ORAL
  Filled 2015-03-19 (×3): qty 1

## 2015-03-19 MED ORDER — POLYETHYLENE GLYCOL 3350 17 G PO PACK: 17.0000 g | PACK | Freq: Every day | ORAL | Status: DC | PRN

## 2015-03-19 MED ORDER — METOCLOPRAMIDE HCL 5 MG PO TABS
5.0000 mg | ORAL_TABLET | Freq: Three times a day (TID) | ORAL | Status: DC | PRN
Start: 2015-03-19 — End: 2015-03-21

## 2015-03-19 MED ORDER — ONDANSETRON HCL 4 MG PO TABS: 4.0000 mg | ORAL_TABLET | Freq: Three times a day (TID) | ORAL | Status: DC | PRN

## 2015-03-19 MED ORDER — PROMETHAZINE HCL 25 MG/ML IJ SOLN
6.2500 mg | INTRAMUSCULAR | Status: DC | PRN
  Administered 2015-03-19: 6.25 mg via INTRAVENOUS

## 2015-03-19 MED ORDER — MEPERIDINE HCL 25 MG/ML IJ SOLN: 6.2500 mg | INTRAMUSCULAR | Status: DC | PRN

## 2015-03-19 MED ORDER — ZOLPIDEM TARTRATE 5 MG PO TABS: 5.0000 mg | ORAL_TABLET | Freq: Every evening | ORAL | Status: DC | PRN

## 2015-03-19 MED ORDER — SUCCINYLCHOLINE CHLORIDE 20 MG/ML IJ SOLN
INTRAMUSCULAR | Status: AC
  Filled 2015-03-19: qty 2

## 2015-03-19 MED ORDER — DIAZEPAM 2 MG PO TABS
2.0000 mg | ORAL_TABLET | Freq: Three times a day (TID) | ORAL | Status: DC | PRN
  Administered 2015-03-19: 2 mg via ORAL
  Filled 2015-03-19: qty 1

## 2015-03-19 MED ORDER — PROPOFOL 500 MG/50ML IV EMUL
INTRAVENOUS | Status: DC | PRN
  Administered 2015-03-19: 75 ug/kg/min via INTRAVENOUS

## 2015-03-19 SURGICAL SUPPLY — 68 items
APL SKNCLS STERI-STRIP NONHPOA (GAUZE/BANDAGES/DRESSINGS) ×1
BANDAGE ELASTIC 4 VELCRO ST LF (GAUZE/BANDAGES/DRESSINGS) ×3 IMPLANT
BANDAGE ELASTIC 6 VELCRO ST LF (GAUZE/BANDAGES/DRESSINGS) ×2 IMPLANT
BANDAGE ESMARK 6X9 LF (GAUZE/BANDAGES/DRESSINGS) ×1 IMPLANT
BENZOIN TINCTURE PRP APPL 2/3 (GAUZE/BANDAGES/DRESSINGS) ×2 IMPLANT
BLADE SAG 18X100X1.27 (BLADE) ×4 IMPLANT
BNDG CMPR 9X6 STRL LF SNTH (GAUZE/BANDAGES/DRESSINGS) ×1
BNDG ESMARK 6X9 LF (GAUZE/BANDAGES/DRESSINGS) ×2
BOWL SMART MIX CTS (DISPOSABLE) ×2 IMPLANT
CAPT KNEE TOTAL 3 ×1 IMPLANT
CEMENT BONE SIMPLEX SPEEDSET (Cement) ×4 IMPLANT
COVER SURGICAL LIGHT HANDLE (MISCELLANEOUS) ×2 IMPLANT
CUFF TOURNIQUET SINGLE 34IN LL (TOURNIQUET CUFF) ×2 IMPLANT
DRAPE EXTREMITY T 121X128X90 (DRAPE) ×2 IMPLANT
DRAPE IMP U-DRAPE 54X76 (DRAPES) ×2 IMPLANT
DRAPE PROXIMA HALF (DRAPES) ×2 IMPLANT
DRAPE U-SHAPE 47X51 STRL (DRAPES) ×2 IMPLANT
DRSG PAD ABDOMINAL 8X10 ST (GAUZE/BANDAGES/DRESSINGS) ×2 IMPLANT
DURAPREP 26ML APPLICATOR (WOUND CARE) ×4 IMPLANT
ELECT CAUTERY BLADE 6.4 (BLADE) ×2 IMPLANT
ELECT REM PT RETURN 9FT ADLT (ELECTROSURGICAL) ×2
ELECTRODE REM PT RTRN 9FT ADLT (ELECTROSURGICAL) ×1 IMPLANT
EVACUATOR 1/8 PVC DRAIN (DRAIN) ×2 IMPLANT
FACESHIELD WRAPAROUND (MASK) ×4 IMPLANT
FACESHIELD WRAPAROUND OR TEAM (MASK) ×2 IMPLANT
GAUZE SPONGE 4X4 12PLY STRL (GAUZE/BANDAGES/DRESSINGS) ×2 IMPLANT
GLOVE BIOGEL PI IND STRL 7.0 (GLOVE) ×1 IMPLANT
GLOVE BIOGEL PI INDICATOR 7.0 (GLOVE) ×2
GLOVE ECLIPSE 7.0 STRL STRAW (GLOVE) ×2 IMPLANT
GLOVE ORTHO TXT STRL SZ7.5 (GLOVE) ×2 IMPLANT
GLOVE SURG ORTHO 7.0 STRL STRW (GLOVE) ×3 IMPLANT
GOWN STRL REUS W/ TWL LRG LVL3 (GOWN DISPOSABLE) ×2 IMPLANT
GOWN STRL REUS W/ TWL XL LVL3 (GOWN DISPOSABLE) ×1 IMPLANT
GOWN STRL REUS W/TWL LRG LVL3 (GOWN DISPOSABLE) ×6
GOWN STRL REUS W/TWL XL LVL3 (GOWN DISPOSABLE) ×4
HANDPIECE INTERPULSE COAX TIP (DISPOSABLE) ×2
IMMOBILIZER KNEE 22 UNIV (SOFTGOODS) ×1 IMPLANT
IMMOBILIZER KNEE 24 THIGH 36 (MISCELLANEOUS) IMPLANT
IMMOBILIZER KNEE 24 UNIV (MISCELLANEOUS) ×2
KIT BASIN OR (CUSTOM PROCEDURE TRAY) ×2 IMPLANT
KIT ROOM TURNOVER OR (KITS) ×2 IMPLANT
MANIFOLD NEPTUNE II (INSTRUMENTS) ×2 IMPLANT
NDL 18GX1X1/2 (RX/OR ONLY) (NEEDLE) ×1 IMPLANT
NDL HYPO 25GX1X1/2 BEV (NEEDLE) ×1 IMPLANT
NEEDLE 18GX1X1/2 (RX/OR ONLY) (NEEDLE) ×2 IMPLANT
NEEDLE HYPO 25GX1X1/2 BEV (NEEDLE) ×2 IMPLANT
NS IRRIG 1000ML POUR BTL (IV SOLUTION) ×2 IMPLANT
PACK TOTAL JOINT (CUSTOM PROCEDURE TRAY) ×2 IMPLANT
PACK UNIVERSAL I (CUSTOM PROCEDURE TRAY) ×1 IMPLANT
PAD ARMBOARD 7.5X6 YLW CONV (MISCELLANEOUS) ×4 IMPLANT
PAD CAST 4YDX4 CTTN HI CHSV (CAST SUPPLIES) ×1 IMPLANT
PADDING CAST COTTON 4X4 STRL (CAST SUPPLIES) ×2
SET HNDPC FAN SPRY TIP SCT (DISPOSABLE) ×1 IMPLANT
STRIP CLOSURE SKIN 1/2X4 (GAUZE/BANDAGES/DRESSINGS) ×3 IMPLANT
SUCTION FRAZIER HANDLE 10FR (MISCELLANEOUS) ×1
SUCTION TUBE FRAZIER 10FR DISP (MISCELLANEOUS) ×1 IMPLANT
SUT MNCRL AB 4-0 PS2 18 (SUTURE) ×3 IMPLANT
SUT VIC AB 0 CT1 27 (SUTURE) ×4
SUT VIC AB 0 CT1 27XBRD ANBCTR (SUTURE) IMPLANT
SUT VIC AB 1 CTX 36 (SUTURE) ×4
SUT VIC AB 1 CTX36XBRD ANBCTR (SUTURE) ×1 IMPLANT
SUT VIC AB 2-0 CT1 27 (SUTURE) ×4
SUT VIC AB 2-0 CT1 TAPERPNT 27 (SUTURE) ×2 IMPLANT
SYR 50ML LL SCALE MARK (SYRINGE) ×2 IMPLANT
SYR CONTROL 10ML LL (SYRINGE) ×2 IMPLANT
TOWEL OR 17X24 6PK STRL BLUE (TOWEL DISPOSABLE) ×2 IMPLANT
TOWEL OR 17X26 10 PK STRL BLUE (TOWEL DISPOSABLE) ×2 IMPLANT
TRAY CATH 16FR W/PLASTIC CATH (SET/KITS/TRAYS/PACK) ×1 IMPLANT

## 2015-03-19 NOTE — H&P (View-Only) (Signed)
TOTAL KNEE ADMISSION H&P  Patient is being admitted for right total knee arthroplasty.  Subjective:  Chief Complaint:right knee pain.  HPI: Marilyn Hernandez, 65 y.o. female, has a history of pain and functional disability in the right knee due to arthritis and has failed non-surgical conservative treatments for greater than 12 weeks to includeNSAID's and/or analgesics and corticosteriod injections.  Onset of symptoms was gradual, starting 7 years ago with gradually worsening course since that time. The patient noted prior procedures on the knee to include  arthroscopy and menisectomy on the right knee(s).  Patient currently rates pain in the right knee(s) at 9 out of 10 with activity. Patient has night pain, worsening of pain with activity and weight bearing, pain that interferes with activities of daily living, pain with passive range of motion and crepitus.  Patient has evidence of subchondral sclerosis and joint space narrowing by imaging studies. There is no active infection.  Patient Active Problem List   Diagnosis Date Noted  . S/P laparoscopic appendectomy 10/06/2011  . Acute appendicitis 09/17/2011  . HTN (hypertension) 06/05/2010  . SYNCOPE, HX OF 12/23/2009  . Rheumatoid arthritis(714.0) 12/17/2009  . PLEURAL EFFUSION, LEFT 11/21/2009  . CHEST PAIN-PAINFUL RESPIRATION 11/21/2009  . HYPERCHOLESTEROLEMIA 11/03/2009  . CORONARY ATHEROSCLEROSIS NATIVE CORONARY ARTERY 11/03/2009  . FIBRILLATION, ATRIAL 11/03/2009  . CORONARY ARTERY BYPASS GRAFT, HX OF 11/03/2009   Past Medical History  Diagnosis Date  . Hyperlipidemia   . Coronary artery disease     s/p NSTEMI 09/2009; s/p CABG 09/2009 [facility] [place]);echo 12/12/2009: EF 55-60%, normal RVF  . Pleural effusion     post CABG  . COPD (chronic obstructive pulmonary disease)   . RA (rheumatoid arthritis)   . Pulmonary nodules   . Acute appendicitis 09/17/2011    Past Surgical History  Procedure Laterality Date  . C-sections      x2  . Arthroscopic knee surgery    . Stapedectomy    . Coronary artery bypass graft      LIMA to the LAD, SVG right coronary artery, SVG to obtuse marginal September 2011   . Laparoscopic appendectomy  09/16/2011    Procedure: APPENDECTOMY LAPAROSCOPIC;  Surgeon: Zenovia Jarred, MD;  Location: [place];  Service: General;  Laterality: N/A;  . Appendectomy       (Not in a hospital admission) Allergies  Allergen Reactions  . Naproxen Sodium     Social History  Substance Use Topics  . Smoking status: Never Smoker   . Smokeless tobacco: Never Used  . Alcohol Use: No    Family History  Problem Relation Age of Onset  . Heart disease Mother     bypass surgery in her 5s  . Arrhythmia Brother     atrial fibrillation     Review of Systems  Constitutional: Negative.   HENT: Negative.   Eyes: Negative.   Respiratory: Negative.   Cardiovascular: Negative.   Gastrointestinal: Negative.   Genitourinary: Negative.   Musculoskeletal: Positive for joint pain.  Skin: Negative.   Neurological: Negative.   Endo/Heme/Allergies: Negative.   Psychiatric/Behavioral: Negative.     Objective:  Physical Exam  Constitutional: She is oriented to person, place, and time. She appears well-developed and well-nourished.  HENT:  Head: Normocephalic and atraumatic.  Eyes: EOM are normal. Pupils are equal, round, and reactive to light.  Neck: Normal range of motion. Neck supple.  Cardiovascular: Normal rate and regular rhythm.   Respiratory: Effort normal and breath sounds normal.  GI: Soft.  Bowel sounds are normal.  Musculoskeletal:  Examination of both knees reveal varus thrust on the right.  Range of motion 0-90 degrees.  Moderate patellofemoral crepitus.  Medial and lateral joint line tenderness.  She is neurovascularly intact distally.    Neurological: She is alert and oriented to person, place, and time.  Skin: Skin is warm and dry.  Psychiatric: She has a normal mood and affect. Her  behavior is normal. Judgment and thought content normal.    Vital signs in last 24 hours: @VSRANGES @  Labs:   Estimated body mass index is 30.69 kg/(m^2) as calculated from the following:   Height as of 07/11/14: 5\' 9"  (1.753 m).   Weight as of 07/11/14: 94.303 kg (207 lb 14.4 oz).   Imaging Review Plain radiographs demonstrate severe degenerative joint disease of the right knee(s). The overall alignment isneutral. The bone quality appears to be fair for age and reported activity level.  Assessment/Plan:  End stage arthritis, right knee   The patient history, physical examination, clinical judgment of the provider and imaging studies are consistent with end stage degenerative joint disease of the right knee(s) and total knee arthroplasty is deemed medically necessary. The treatment options including medical management, injection therapy arthroscopy and arthroplasty were discussed at length. The risks and benefits of total knee arthroplasty were presented and reviewed. The risks due to aseptic loosening, infection, stiffness, patella tracking problems, thromboembolic complications and other imponderables were discussed. The patient acknowledged the explanation, agreed to proceed with the plan and consent was signed. Patient is being admitted for inpatient treatment for surgery, pain control, PT, OT, prophylactic antibiotics, VTE prophylaxis, progressive ambulation and ADL's and discharge planning. The patient is planning to be discharged home with home health services

## 2015-03-19 NOTE — Anesthesia Procedure Notes (Signed)
Spinal Patient location during procedure: OR Start time: 03/19/2015 10:08 AM End time: 03/19/2015 10:13 AM Staffing Anesthesiologist: Annye Asa Performed by: anesthesiologist  Preanesthetic Checklist Completed: patient identified, surgical consent, pre-op evaluation, timeout performed, IV checked, risks and benefits discussed and monitors and equipment checked Spinal Block Patient position: sitting Prep: Betadine, ChloraPrep and site prepped and draped Patient monitoring: heart rate, cardiac monitor, continuous pulse ox and blood pressure Approach: midline Location: L3-4 Injection technique: single-shot Needle Needle type: Quincke  Needle gauge: 25 G Needle length: 9 cm Additional Notes Pt identified in Holding room.  Monitors applied. Working IV access confirmed. Sterile prep, drape lumbar spine. 1% lido local to L 3,4.  #25ga Quincke into clear CSF, positive asp CSF beginning and end of injection 15mg  Bupivacaine with dextrose.  Patient asymptomatic, VSS, no heme aspirated, tolerated well.  Jenita Seashore, MD

## 2015-03-19 NOTE — OR Nursing (Signed)
Pt. In and out cathed at end of procedure by Audree Bane, RN.  Total included in I&O flowsheet.

## 2015-03-19 NOTE — Progress Notes (Signed)
Orthopedic Tech Progress Note Patient Details:  Marilyn Hernandez [birth year] [id number] Applied CPM to RLE.  Placed OHF with trapeze to pt.'s bed.  CPM Right Knee CPM Right Knee: On Right Knee Flexion (Degrees): 90 Right Knee Extension (Degrees): 0   Darrol Poke 03/19/2015, 2:07 PM

## 2015-03-19 NOTE — Transfer of Care (Signed)
Immediate Anesthesia Transfer of Care Note  Patient: Marilyn Hernandez  Procedure(s) Performed: Procedure(s): RIGHT TOTAL KNEE ARTHROPLASTY (Right)  Patient Location: PACU  Anesthesia Type:Spinal  Level of Consciousness: awake, oriented and patient cooperative  Airway & Oxygen Therapy: Patient Spontanous Breathing  Post-op Assessment: Report given to RN and Post -op Vital signs reviewed and stable  Post vital signs: Reviewed and stable  Last Vitals:  Filed Vitals:   03/19/15 0905  BP: 152/74  Pulse: 66  Temp: 36.7 C  Resp: 18    Complications: No apparent anesthesia complications

## 2015-03-19 NOTE — Evaluation (Signed)
Physical Therapy Evaluation Patient Details Name: Marilyn Hernandez MRN: [id number] DOB: 05/05/1950 Today's Date: 03/19/2015   History of Present Illness  65 y.o. female s/p Rt total knee arthroplasty. 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  Clinical Impression  Pt is s/p Rt TKA presenting with the deficits listed below (see PT Problem List). Tolerated bed mobility and transfer training post op day #0. Limited by lightheadedness in standing. Very motivated to work with therapy. Reviewed use of zero knee foam for positioning and therapeutic exercises. Husband present and supportive. States SNF has been pre-arranged as pt will have limited help at home initially. Pt will benefit from skilled PT to increase their independence and safety with mobility to allow discharge to the venue listed below.      Follow Up Recommendations SNF;Supervision for mobility/OOB    Equipment Recommendations  None recommended by PT    Recommendations for Other Services       Precautions / Restrictions Precautions Precautions: Knee Precaution Booklet Issued: Yes (comment) Precaution Comments: reviewed handout Required Braces or Orthoses: Knee Immobilizer - Right Restrictions Weight Bearing Restrictions: Yes RLE Weight Bearing: Weight bearing as tolerated      Mobility  Bed Mobility Overal bed mobility: Needs Assistance Bed Mobility: Supine to Sit     Supine to sit: Min guard;HOB elevated     General bed mobility comments: Cues for technique. Requires extra time. Practiced x3 towards Lt. Use of rail needed.  Transfers Overall transfer level: Needs assistance Equipment used: Rolling walker (2 wheeled) Transfers: Sit to/from [facility] Sit to Stand: Mod assist Stand pivot transfers: Min assist       General transfer comment: Mod assist for boost to stand x2 from bed. First attempt pt became lightheaded and needed to sit. Less  dizziness second trial although still present. Min assist for Rt knee block with pivot to chair and assist for walker management. Minimal knee instability noted without significant buckling. Good use of UEs for support. Good control with descent into chair following commands for hand placement with Sit<>stand transitions.  Ambulation/Gait             General Gait Details: Limited due to lightheadedness  Stairs            Wheelchair Mobility    Modified Rankin (Stroke Patients Only)       Balance Overall balance assessment: Needs assistance Sitting-balance support: No upper extremity supported;Feet supported Sitting balance-Leahy Scale: Good     Standing balance support: Bilateral upper extremity supported Standing balance-Leahy Scale: Poor Standing balance comment: Standing activity focusing on weight-shifting to Lt and Rt while extending Rt knee in stance phase with cues for quad activation.                             Pertinent Vitals/Pain Pain Assessment: 0-10 Pain Score: 10-Worst pain ever (although laughing) Pain Location: Rt knee Pain Descriptors / Indicators: Aching Pain Intervention(s): Monitored during session;Premedicated before session;Repositioned;Limited activity within patient's tolerance    Home Living Family/patient expects to be discharged to:: Skilled nursing facility Living Arrangements: Spouse/significant other Available Help at Discharge: Family;Friend(s);Available PRN/intermittently Type of Home: House Home Access: Stairs to enter Entrance Stairs-Rails: Right;Left (being built now) Technical brewer of Steps: 7 Home Layout: One level Home Equipment: Walker - 2 wheels      Prior Function Level of Independence: Independent with assistive device(s)  Comments: sometimes using a cane to ambulate     Hand Dominance   Dominant Hand: Right    Extremity/Trunk Assessment   Upper Extremity Assessment: Defer to OT  evaluation           Lower Extremity Assessment: RLE deficits/detail RLE Deficits / Details: decreased strength and ROM as expected post op       Communication   Communication: No difficulties  Cognition Arousal/Alertness: Awake/alert Behavior During Therapy: WFL for tasks assessed/performed Overall Cognitive Status: Within Functional Limits for tasks assessed                      General Comments General comments (skin integrity, edema, etc.): Reviewed use of zero knee positioning and exercises with patient and husband.    Exercises Total Joint Exercises Ankle Circles/Pumps: AROM;Both;10 reps;Seated Quad Sets: Strengthening;Both;5 reps;Seated      Assessment/Plan    PT Assessment Patient needs continued PT services  PT Diagnosis Difficulty walking;Acute pain   PT Problem List Decreased strength;Decreased range of motion;Decreased activity tolerance;Decreased balance;Decreased mobility;Decreased knowledge of use of DME;Decreased knowledge of precautions;Pain  PT Treatment Interventions DME instruction;Gait training;Stair training;Functional mobility training;Therapeutic activities;Therapeutic exercise;Balance training;Neuromuscular re-education;Patient/family education   PT Goals (Current goals can be found in the Care Plan section) Acute Rehab PT Goals Patient Stated Goal: Go to rehab PT Goal Formulation: With patient Time For Goal Achievement: 03/26/15 Potential to Achieve Goals: Good    Frequency 7X/week   Barriers to discharge Decreased caregiver support Limited support from husband at home initially    Co-evaluation               End of Session Equipment Utilized During Treatment: Gait belt;Oxygen (2L reapplied at end of session) Activity Tolerance: Patient tolerated treatment well;Other (comment) (Lightheadedness with standing.) Patient left: in chair;with call bell/phone within reach;with family/visitor present;with SCD's reapplied Nurse  Communication: Mobility status         Time: [id number] PT Time Calculation (min) (ACUTE ONLY): 39 min   Charges:   PT Evaluation $PT Eval Moderate Complexity: 1 Procedure PT Treatments $Therapeutic Activity: 23-37 mins   PT G CodesEllouise Newer 03/19/2015, 6:40 PM  Camille Bal [place], [place]

## 2015-03-19 NOTE — Anesthesia Postprocedure Evaluation (Signed)
Anesthesia Post Note  Patient: Marilyn Hernandez  Procedure(s) Performed: Procedure(s) (LRB): RIGHT TOTAL KNEE ARTHROPLASTY (Right)  Patient location during evaluation: PACU Anesthesia Type: Spinal Level of consciousness: sedated, patient cooperative and responds to stimulation Pain management: pain level controlled Vital Signs Assessment: post-procedure vital signs reviewed and stable Respiratory status: spontaneous breathing, nonlabored ventilation and respiratory function stable Cardiovascular status: blood pressure returned to baseline and stable Postop Assessment: patient able to bend at knees and spinal receding (nausea improved) Anesthetic complications: no    Last Vitals:  Filed Vitals:   03/19/15 1437 03/19/15 1452  BP: 125/74 127/64  Pulse: 59 62  Temp:    Resp: 10 13    Last Pain:  Filed Vitals:   03/19/15 1504  PainSc: 5                  Makaylie Dedeaux,E. Jameika Kinn

## 2015-03-19 NOTE — Op Note (Signed)
NAME:  Marilyn Hernandez, Marilyn Hernandez NO.:  [id number]  MEDICAL RECORD NO.:  [id number]  LOCATION:  [facility]                         FACILITY:  [facility]  PHYSICIAN:  Ninetta Lights, M.D. DATE OF BIRTH:  1950-09-01  DATE OF PROCEDURE:  03/19/2015 DATE OF DISCHARGE:                              OPERATIVE REPORT   PREOPERATIVE DIAGNOSIS:  Right knee end-stage arthritis, primary generalized.  POSTOPERATIVE DIAGNOSIS:  Right knee end-stage arthritis, primary generalized, with moderate osteopenia throughout the knee, except for the medial tibial plateau.  Extensive erosive changes on the patella, which was concave.  PROCEDURE:  Right knee modified, minimally invasive total knee replacement with Stryker triathlon prosthesis.  A cemented pegged cruciate retaining #5 femoral component.  Cemented #5 tibial component, 9 mm CS insert.  Cemented resurfacing 32-mm patellar component.  SURGEON:  Ninetta Lights, M.D.  ASSISTANT:  Elmyra Ricks, PA, present throughout the entire case and necessary for timely completion of procedure.  ANESTHESIA:  Spinal.  BLOOD LOSS:  Minimal.  SPECIMENS:  None.  CULTURES:  None.  COMPLICATIONS:  None.  DRESSINGS:  Soft compressive knee immobilizer.  TOURNIQUET TIME:  45 minutes.  PROCEDURE:  The patient was brought to operating room, placed on the operating table in supine position.  After adequate anesthesia had been obtained, tourniquet applied.  Prepped and draped in usual sterile fashion.  Exsanguinated with elevation of Esmarch.  Tourniquet inflated to 350 mmHg.  Straight incision above the patella down the tibial tubercle.  Skin and subcutaneous tissue divided.  Medial arthrotomy, vastus splitting, preserving quad tendon.  Medial capsule release. Significant erosive degenerative changes throughout.  A 7-degree flexion contracture.  The trochlea markedly destroyed and the patella concave on the lateral side.  Flexible intramedullary  guide, removing 10 mm off the distal femur, 5 degrees of valgus.  Extremely osteopenic.  Using epicondylar axis, the femur was sized, cut, and fitted for a cruciate retaining pegged #5 component.  Proximal tibial resection with extramedullary guide.  A 3-degree posterior slope cut.  This was revised with an additional 2 mm resection before I could get a balanced knee with full extension.  Patella exposed.  Posterior 10 mm removed preserving as much bone as possible.  Drilled, sized, and fitted for a 32-mm component.  Debris cleared throughout.  Trials put in place.  With the 9 mm CS insert, I was able to get full extension, full flexion, nicely balanced knee, good patellar tracking.  Tibia was marked for rotation and reamed.  All trials removed.  Copious irrigation with pulse irrigating device.  Cement prepared, placed on all components, firmly seated.  Polyethylene attached to tibia, knee reduced.  Patella held with a clamp.  Once cement hardened, the knee was irrigated again.  Soft tissues injected with Exparel.  Arthrotomy closed with #1 Vicryl.  Skin and subcutaneous tissue.  The subcutaneous subcuticular closure. Margins were injected with Marcaine.  Sterile compressive dressing applied.  Tourniquet deflated removed.  Knee immobilizer applied. Anesthesia reversed.  Brought to the recovery room.  Tolerated the surgery well.  No complications.     Ninetta Lights, M.D.     DFM/MEDQ  D:  03/19/2015  T:  03/19/2015  Job:  [id number]

## 2015-03-19 NOTE — Progress Notes (Signed)
Utilization review completed.  

## 2015-03-19 NOTE — Anesthesia Preprocedure Evaluation (Addendum)
Anesthesia Evaluation  Patient identified by MRN, date of birth, ID band Patient awake    Reviewed: Allergy & Precautions, NPO status , Patient's Chart, lab work & pertinent test results, reviewed documented beta blocker date and time   History of Anesthesia Complications Negative for: history of anesthetic complications  Airway Mallampati: II  TM Distance: >3 FB Neck ROM: Full    Dental  (+) Teeth Intact, Dental Advisory Given   Pulmonary neg pulmonary ROS,    breath sounds clear to auscultation       Cardiovascular hypertension, Pt. on medications and Pt. on home beta blockers (-) angina+ CAD, + Past MI and + CABG   Rhythm:Regular Rate:Normal  '11 ECHO: EF 55-60%, valves OK   Neuro/Psych negative neurological ROS     GI/Hepatic Neg liver ROS, GERD  Controlled,  Endo/Other  Morbid obesity  Renal/GU negative Renal ROS     Musculoskeletal   Abdominal (+) + obese,   Peds  Hematology negative hematology ROS (+)   Anesthesia Other Findings   Reproductive/Obstetrics                          Anesthesia Physical Anesthesia Plan  ASA: III  Anesthesia Plan: Spinal   Post-op Pain Management:    Induction:   Airway Management Planned: Natural Airway and Simple Face Mask  Additional Equipment: None  Intra-op Plan:   Post-operative Plan:   Informed Consent: I have reviewed the patients History and Physical, chart, labs and discussed the procedure including the risks, benefits and alternatives for the proposed anesthesia with the patient or authorized representative who has indicated his/her understanding and acceptance.   Dental advisory given  Plan Discussed with: CRNA, Anesthesiologist and Surgeon  Anesthesia Plan Comments: (Plan routine monitors, SAB)       Anesthesia Quick Evaluation

## 2015-03-19 NOTE — Discharge Summary (Addendum)
Patient ID: Marilyn Hernandez MRN: [id number] DOB/AGE: 09-02-1950 65 y.o.  Admit date: 03/19/2015 Discharge date: 03/21/2015  Admission Diagnoses:  Active Problems:   S/P total knee replacement   Discharge Diagnoses:  Same  Past Medical History  Diagnosis Date  . Hyperlipidemia   . Coronary artery disease     s/p NSTEMI 09/2009; s/p CABG 09/2009 [facility], [place]);echo 12/12/2009: EF 55-60%, normal RVF  . Pleural effusion 09/2009    post CABG  . Pulmonary nodules   . Hypertension   . URI (upper respiratory infection)     hx  . GERD (gastroesophageal reflux disease)     occ  . 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 Complication of anesthesia     "took me a little longer to wakeup in 1979"  . COPD (chronic obstructive pulmonary disease) ([place])     denies 03/19/2015  . Anemia 1989    "before hysterectomy"  . RA (rheumatoid arthritis) ([place])     "took strong nutrition; working well for me" (03/19/2015)  . History of gout   . NSTEMI (non-ST elevated myocardial infarction) ([place]) 09/2009    Surgeries: Procedure(s): RIGHT TOTAL KNEE ARTHROPLASTY on 03/19/2015   Consultants:    Discharged Condition: Improved  Hospital Course: Marilyn Hernandez is an 65 y.o. female who was admitted 03/19/2015 for operative treatment of primary localized osteoarthritis right knee. Patient has severe unremitting pain that affects sleep, daily activities, and work/hobbies. After pre-op clearance the patient was taken to the operating room on 03/19/2015 and underwent  Procedure(s): RIGHT TOTAL KNEE ARTHROPLASTY.  Patient with a pre-op Hb of 13.3 developed ABLA on pod#1 with a Hb of 9.7.  She is currently stable but we will continue to follow.  Patient was given perioperative antibiotics:      Anti-infectives    Start     Dose/Rate Route Frequency Ordered Stop   03/19/15 1630  ceFAZolin (ANCEF) IVPB 2 g/50 mL premix     2 g 100 mL/hr over 30 Minutes Intravenous Every 6 hours 03/19/15 1536 03/19/15 2316   03/19/15 0930  ceFAZolin (ANCEF) IVPB 2 g/50 mL premix     2 g 100 mL/hr over 30 Minutes Intravenous To ShortStay Surgical 03/18/15 1331 03/19/15 1020       Patient was given sequential compression devices, early ambulation, and chemoprophylaxis to prevent DVT.  Patient benefited maximally from hospital stay and there were no complications.    Recent vital signs:  Patient Vitals for the past 24 hrs:  BP Temp Temp src Pulse Resp SpO2  03/21/15 0442 129/64 mmHg 98.4 F (36.9 C) Oral 67 16 100 %  03/20/15 2043 128/60 mmHg 98.7 F (37.1 C) Oral 68 18 95 %  03/20/15 1254 128/72 mmHg 98.5 F (36.9 C) - 69 16 96 %     Recent laboratory studies:   Recent Labs  03/20/15 0520 03/21/15 0531  WBC 10.9* 17.3*  HGB 9.7* 9.5*  HCT 31.7* 29.3*  PLT 303 253  NA 141 139  K 4.4 4.2  CL 106 106  CO2 25 26  BUN 14 16  CREATININE 0.96 1.05*  GLUCOSE 146* 131*  CALCIUM 8.5* 9.0     Discharge Medications:     Medication List    STOP taking these medications        aspirin 81 MG tablet     Fish Oil 1000 MG Caps     glucosamine-chondroitin 500-400 MG tablet  TAKE these medications        apixaban 2.5 MG Tabs tablet  Commonly known as:  ELIQUIS  Take 1 tab po q12 hours x 14 days following surgery to prevent blood clots     bisacodyl 5 MG EC tablet  Commonly known as:  DULCOLAX  Take 1 tablet (5 mg total) by mouth daily as needed for moderate constipation.     Co Q 10 100 MG Caps  Take 100 mg by mouth at bedtime.     folic acid Q000111Q MCG tablet  Commonly known as:  FOLVITE  Take 400 mcg by mouth daily.     furosemide 20 MG tablet  Commonly known as:  LASIX  Take 1 tablet (20 mg total) by mouth as needed.     metoprolol tartrate 25 MG tablet  Commonly known as:  LOPRESSOR  Take 0.5 tablets (12.5 mg total) by mouth 2 (two) times daily.     nitroGLYCERIN 0.4 MG SL tablet  Commonly known as:  NITROSTAT  PLACE 1 TABLET UNDER TONGUE EVERY 5 MINUTES IF NEEDED FOR  CHEST PAIN     ondansetron 4 MG tablet  Commonly known as:  ZOFRAN  Take 1 tablet (4 mg total) by mouth every 8 (eight) hours as needed for nausea or vomiting.     oxyCODONE-acetaminophen 5-325 MG tablet  Commonly known as:  ROXICET  Take 1-2 tablets by mouth every 4 (four) hours as needed.     potassium chloride 10 MEQ tablet  Commonly known as:  KLOR-CON M10  TAKE 1 TABLET BY MOUTH EVERY DAY     rosuvastatin 40 MG tablet  Commonly known as:  CRESTOR  Take 1 tablet (40 mg total) by mouth daily.     vitamin B-12 1000 MCG tablet  Commonly known as:  CYANOCOBALAMIN  Take 1,000 mcg by mouth daily. With folic acid [id number] mcg     Vitamin D3 2000 units Tabs  Take 1 capsule by mouth daily.        Diagnostic Studies: Dg Knee Right Port  03/19/2015  CLINICAL DATA:  Status post right knee replacement today. Postoperative examination. Initial encounter. EXAM: PORTABLE RIGHT KNEE - 1-2 VIEW COMPARISON:  None. FINDINGS: Right total knee arthroplasty is in place. The device is located and there is no fracture. Gas in the soft tissues from surgery is noted. IMPRESSION: Right total knee replacement without evidence complication. Electronically Signed   By: Inge Rise M.D.   On: 03/19/2015 13:41    Disposition: 01-Home or Self Care    Follow-up Information    Follow up with Ninetta Lights, MD In 2 weeks.   Specialty:  Orthopedic Surgery   Contact information:   [place] [address] [phone]        Signed: Fannie Knee 03/21/2015, 8:16 AM

## 2015-03-19 NOTE — Interval H&P Note (Signed)
History and Physical Interval Note:  03/19/2015 8:16 AM  Marilyn Hernandez  has presented today for surgery, with the diagnosis of djd right knee  The various methods of treatment have been discussed with the patient and family. After consideration of risks, benefits and other options for treatment, the patient has consented to  Procedure(s): RIGHT TOTAL KNEE ARTHROPLASTY (Right) as a surgical intervention .  The patient's history has been reviewed, patient examined, no change in status, stable for surgery.  I have reviewed the patient's chart and labs.  Questions were answered to the patient's satisfaction.     Ninetta Lights

## 2015-03-20 ENCOUNTER — Encounter (HOSPITAL_COMMUNITY): Payer: Self-pay | Admitting: Orthopedic Surgery

## 2015-03-20 LAB — BASIC METABOLIC PANEL
Anion gap: 10 (ref 5–15)
BUN: 14 mg/dL (ref 6–20)
CALCIUM: 8.5 mg/dL — AB (ref 8.9–10.3)
CO2: 25 mmol/L (ref 22–32)
Chloride: 106 mmol/L (ref 101–111)
Creatinine, Ser: 0.96 mg/dL (ref 0.44–1.00)
GFR calc Af Amer: >60 mL/min (ref >60)
GLUCOSE: 146 mg/dL — AB (ref 65–99)
POTASSIUM: 4.4 mmol/L (ref 3.5–5.1)
SODIUM: 141 mmol/L (ref 135–145)

## 2015-03-20 LAB — CBC
HCT: 31.7 % — ABNORMAL LOW (ref 36.0–46.0)
Hemoglobin: 9.7 g/dL — ABNORMAL LOW (ref 12.0–15.0)
MCH: 26.3 pg (ref 26.0–34.0)
MCHC: 30.6 g/dL (ref 30.0–36.0)
MCV: 85.9 fL (ref 78.0–100.0)
PLATELETS: 303 10*3/uL (ref 150–400)
RBC: 3.69 MIL/uL — AB (ref 3.87–5.11)
RDW: 15.9 % — AB (ref 11.5–15.5)
WBC: 10.9 10*3/uL — AB (ref 4.0–10.5)

## 2015-03-20 NOTE — Clinical Social Work Placement (Signed)
   CLINICAL SOCIAL WORK PLACEMENT  NOTE  Date:  03/20/2015  Patient Details  Name: Marilyn Hernandez MRN: [id number] Date of Birth: October 30, 1950  Clinical Social Work is seeking post-discharge placement for this patient at the [place] level of care (*CSW will initial, date and re-position this form in  chart as items are completed):  Yes   Patient/family provided with [facility] Work Department's list of facilities offering this level of care within the geographic area requested by the patient (or if unable, by the patient's family).  Yes   Patient/family informed of their freedom to choose among providers that offer the needed level of care, that participate in Medicare, Medicaid or managed care program needed by the patient, have an available bed and are willing to accept the patient.  Yes   Patient/family informed of [facility]'s ownership interest in [facility] and [facility], as well as of the fact that they are under no obligation to receive care at these facilities.  PASRR submitted to EDS on 03/20/15     PASRR number received on 03/20/15     Existing PASRR number confirmed on       FL2 transmitted to all facilities in geographic area requested by pt/family on 03/20/15     FL2 transmitted to all facilities within larger geographic area on       Patient informed that his/her managed care company has contracts with or will negotiate with certain facilities, including the following:   (yes)     Yes   Patient/family informed of bed offers received.  Patient chooses bed at  [facility] in [place] )     Physician recommends and patient chooses bed at      Patient to be transferred to   on 03/20/15.  Patient to be transferred to facility by       Patient family notified on   of transfer.  Name of family member notified:        PHYSICIAN Please sign FL2     Additional Comment:     _______________________________________________ [place] intern  [phone]

## 2015-03-20 NOTE — Progress Notes (Signed)
Physical Therapy Treatment Patient Details Name: Marilyn Hernandez MRN: [id number] DOB: December 24, 1950 Today's Date: 03/20/2015    History of Present Illness 65 y.o. female s/p Rt total knee arthroplasty. 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    PT Comments    Patient is progressing slowly toward mobility goals with ambulation of 15 ft this session. Continue to progress as tolerated with anticipated d/c to SNF for further skilled PT services.    Follow Up Recommendations  SNF;Supervision for mobility/OOB     Equipment Recommendations  None recommended by PT    Recommendations for Other Services       Precautions / Restrictions Precautions Precautions: Knee Precaution Booklet Issued: Yes (comment) Precaution Comments: reviewed handout Required Braces or Orthoses: Knee Immobilizer - Right Restrictions Weight Bearing Restrictions: Yes RLE Weight Bearing: Weight bearing as tolerated    Mobility  Bed Mobility Overal bed mobility: Needs Assistance Bed Mobility: Supine to Sit     Supine to sit: Min assist;HOB elevated     General bed mobility comments: OOB in chair upon arrival  Transfers Overall transfer level: Needs assistance Equipment used: Rolling walker (2 wheeled) Transfers: Sit to/from Stand Sit to Stand: Mod assist Stand pivot transfers: Min assist       General transfer comment: assist to power up into standing and increased time needed; vc for hand placement and technique; pt very slow to process commands   Ambulation/Gait Ambulation/Gait assistance: Min guard Ambulation Distance (Feet): 16 Feet Assistive device: Rolling walker (2 wheeled) Gait Pattern/deviations: Step-to pattern;Decreased stance time - right;Decreased step length - left;Antalgic;Trunk flexed (shuffle with Lt foot)     General Gait Details: cues for maintaining task focused and with slow, guarded movements; vc for sequencing, posture,  position of RW, and encouragement to increase WB on R LE   Stairs            Wheelchair Mobility    Modified Rankin (Stroke Patients Only)       Balance Overall balance assessment: Needs assistance Sitting-balance support: No upper extremity supported;Feet supported Sitting balance-Leahy Scale: Fair Sitting balance - Comments: dizzy sitting EOB, BUE for most of EOB sitting   Standing balance support: Bilateral upper extremity supported Standing balance-Leahy Scale: Poor Standing balance comment: Pt reluctant to weight shift onto RLE.                    Cognition Arousal/Alertness: Lethargic;Suspect due to medications Behavior During Therapy: [facility] for tasks assessed/performed Overall Cognitive Status: No family/caregiver present to determine baseline cognitive functioning Area of Impairment: Problem solving;Attention             Problem Solving: Slow processing;Difficulty sequencing;Requires verbal cues      Exercises      General Comments General comments (skin integrity, edema, etc.): pt c/o lightheadedness and nausea without emesis       Pertinent Vitals/Pain Pain Assessment: 0-10 Pain Score: 8  Pain Location: R knee Pain Descriptors / Indicators: Sore Pain Intervention(s): Limited activity within patient's tolerance;Monitored during session;Premedicated before session;Repositioned    Home Living Family/patient expects to be discharged to:: Skilled nursing facility Living Arrangements: Spouse/significant other Available Help at Discharge: Family;Friend(s);Available PRN/intermittently Type of Home: House Home Access: Stairs to enter Entrance Stairs-Rails: Right;Left Home Layout: One level Home Equipment: Walker - 2 wheels      Prior Function Level of Independence: Independent with assistive device(s)      Comments: sometimes using a cane to  ambulate   PT Goals (current goals can now be found in the care plan section) Acute Rehab PT  Goals Patient Stated Goal: Go to rehab Progress towards PT goals: Progressing toward goals    Frequency  7X/week    PT Plan Current plan remains appropriate    Co-evaluation             End of Session Equipment Utilized During Treatment: Gait belt;Right knee immobilizer Activity Tolerance: Patient limited by pain Patient left: in chair;with call bell/phone within reach     Time: 1313-1335 PT Time Calculation (min) (ACUTE ONLY): 22 min  Charges:  $Gait Training: 8-22 mins                    G Codes:      Salina April, PTA Pager: [phone]   03/20/2015, 2:31 PM

## 2015-03-20 NOTE — Progress Notes (Signed)
Orthopedic Tech Progress Note Patient Details:  Marilyn Hernandez 11-17-1950 [id number] Ortho visit cpm rounding on cpm at 1925 Patient ID: Marilyn Hernandez, female   DOB: 18-Sep-1950, 65 y.o.   MRN: [id number]   Braulio Bosch 03/20/2015, 7:23 PM

## 2015-03-20 NOTE — Progress Notes (Signed)
Orthopedic Tech Progress Note Patient Details:  Marilyn Hernandez [birth year] [id number]  Patient ID: Marilyn Hernandez, female   DOB: 20-Sep-1950, 65 y.o.   MRN: [id number] Applied cpm 14-40. Was set to 0-55. Pt. Had to much pain so I set to 14-40 so pt. Could get in cpm. Will tell RN to adjust later if possible.  Karolee Stamps 03/20/2015, 5:49 AM

## 2015-03-20 NOTE — Discharge Instructions (Signed)
INSTRUCTIONS AFTER JOINT REPLACEMENT  ° °o Remove items at home which could result in a fall. This includes throw rugs or furniture in walking pathways °o ICE to the affected joint every three hours while awake for 30 minutes at a time, for at least the first 3-5 days, and then as needed for pain and swelling.  Continue to use ice for pain and swelling. You may notice swelling that will progress down to the foot and ankle.  This is normal after surgery.  Elevate your leg when you are not up walking on it.   °o Continue to use the breathing machine you got in the hospital (incentive spirometer) which will help keep your temperature down.  It is common for your temperature to cycle up and down following surgery, especially at night when you are not up moving around and exerting yourself.  The breathing machine keeps your lungs expanded and your temperature down. ° ° °DIET:  As you were doing prior to hospitalization, we recommend a well-balanced diet. ° °DRESSING / WOUND CARE / SHOWERING ° °Keep the surgical dressing until follow up.  The dressing is water proof, so you can shower without any extra covering.  IF THE DRESSING FALLS OFF or the wound gets wet inside, change the dressing with sterile gauze.  Please use good hand washing techniques before changing the dressing.  Do not use any lotions or creams on the incision until instructed by your surgeon.   ° °ACTIVITY ° °o Increase activity slowly as tolerated, but follow the weight bearing instructions below.   °o No driving for 6 weeks or until further direction given by your physician.  You cannot drive while taking narcotics.  °o No lifting or carrying greater than 10 lbs. until further directed by your surgeon. °o Avoid periods of inactivity such as sitting longer than an hour when not asleep. This helps prevent blood clots.  °o You may return to work once you are authorized by your doctor.  ° ° ° °WEIGHT BEARING  ° °Weight bearing as tolerated with assist  device (walker, cane, etc) as directed, use it as long as suggested by your surgeon or therapist, typically at least 4-6 weeks. ° ° °EXERCISES ° °Results after joint replacement surgery are often greatly improved when you follow the exercise, range of motion and muscle strengthening exercises prescribed by your doctor. Safety measures are also important to protect the joint from further injury. Any time any of these exercises cause you to have increased pain or swelling, decrease what you are doing until you are comfortable again and then slowly increase them. If you have problems or questions, call your caregiver or physical therapist for advice.  ° °Rehabilitation is important following a joint replacement. After just a few days of immobilization, the muscles of the leg can become weakened and shrink (atrophy).  These exercises are designed to build up the tone and strength of the thigh and leg muscles and to improve motion. Often times heat used for twenty to thirty minutes before working out will loosen up your tissues and help with improving the range of motion but do not use heat for the first two weeks following surgery (sometimes heat can increase post-operative swelling).  ° °These exercises can be done on a training (exercise) mat, on the floor, on a table or on a bed. Use whatever works the best and is most comfortable for you.    Use music or television while you are exercising so that   the exercises are a pleasant break in your day. This will make your life better with the exercises acting as a break in your routine that you can look forward to.   Perform all exercises about fifteen times, three times per day or as directed.  You should exercise both the operative leg and the other leg as well. ° °Exercises include: °  °• Quad Sets - Tighten up the muscle on the front of the thigh (Quad) and hold for 5-10 seconds.   °• Straight Leg Raises - With your knee straight (if you were given a brace, keep it on),  lift the leg to 60 degrees, hold for 3 seconds, and slowly lower the leg.  Perform this exercise against resistance later as your leg gets stronger.  °• Leg Slides: Lying on your back, slowly slide your foot toward your buttocks, bending your knee up off the floor (only go as far as is comfortable). Then slowly slide your foot back down until your leg is flat on the floor again.  °• Angel Wings: Lying on your back spread your legs to the side as far apart as you can without causing discomfort.  °• Hamstring Strength:  Lying on your back, push your heel against the floor with your leg straight by tightening up the muscles of your buttocks.  Repeat, but this time bend your knee to a comfortable angle, and push your heel against the floor.  You may put a pillow under the heel to make it more comfortable if necessary.  ° °A rehabilitation program following joint replacement surgery can speed recovery and prevent re-injury in the future due to weakened muscles. Contact your doctor or a physical therapist for more information on knee rehabilitation.  ° ° °CONSTIPATION ° °Constipation is defined medically as fewer than three stools per week and severe constipation as less than one stool per week.  Even if you have a regular bowel pattern at home, your normal regimen is likely to be disrupted due to multiple reasons following surgery.  Combination of anesthesia, postoperative narcotics, change in appetite and fluid intake all can affect your bowels.  ° °YOU MUST use at least one of the following options; they are listed in order of increasing strength to get the job done.  They are all available over the counter, and you may need to use some, POSSIBLY even all of these options:   ° °Drink plenty of fluids (prune juice may be helpful) and high fiber foods °Colace 100 mg by mouth twice a day  °Senokot for constipation as directed and as needed Dulcolax (bisacodyl), take with full glass of water  °Miralax (polyethylene glycol)  once or twice a day as needed. ° °If you have tried all these things and are unable to have a bowel movement in the first 3-4 days after surgery call either your surgeon or your primary doctor.   ° °If you experience loose stools or diarrhea, hold the medications until you stool forms back up.  If your symptoms do not get better within 1 week or if they get worse, check with your doctor.  If you experience "the worst abdominal pain ever" or develop nausea or vomiting, please contact the office immediately for further recommendations for treatment. ° ° °ITCHING:  If you experience itching with your medications, try taking only a single pain pill, or even half a pain pill at a time.  You can also use Benadryl over the counter for itching or also to   help with sleep.   TED HOSE STOCKINGS:  Use stockings on both legs until for at least 2 weeks or as directed by physician office. They may be removed at night for sleeping.  MEDICATIONS:  See your medication summary on the After Visit Summary that nursing will review with you.  You may have some home medications which will be placed on hold until you complete the course of blood thinner medication.  It is important for you to complete the blood thinner medication as prescribed.  PRECAUTIONS:  If you experience chest pain or shortness of breath - call 911 immediately for transfer to the hospital emergency department.   If you develop a fever greater that 101 F, purulent drainage from wound, increased redness or drainage from wound, foul odor from the wound/dressing, or calf pain - CONTACT YOUR SURGEON.                                                   FOLLOW-UP APPOINTMENTS:  If you do not already have a post-op appointment, please call the office for an appointment to be seen by your surgeon.  Guidelines for how soon to be seen are listed in your After Visit Summary, but are typically between 1-4 weeks after surgery.  OTHER INSTRUCTIONS:   Knee  Replacement:  Do not place pillow under knee, focus on keeping the knee straight while resting. CPM instructions: 0-90 degrees, 2 hours in the morning, 2 hours in the afternoon, and 2 hours in the evening. Place foam block, curve side up under heel at all times except when in CPM or when walking.  DO NOT modify, tear, cut, or change the foam block in any way.  MAKE SURE YOU:   Understand these instructions.   Get help right away if you are not doing well or get worse.    Thank you for letting us be a part of your medical care team.  It is a privilege we respect greatly.  We hope these instructions will help you stay on track for a fast and full recovery!      Information on my medicine - ELIQUIS (apixaban)  This medication education was reviewed with me or my healthcare representative as part of my discharge preparation.  The pharmacist that spoke with me during my hospital stay was:  Wayland Salinas, [facility]  Why was Eliquis prescribed for you? Eliquis was prescribed for you to reduce the risk of blood clots forming after orthopedic surgery.    What do You need to know about Eliquis? Take your Eliquis TWICE DAILY - one tablet in the morning and one tablet in the evening with or without food.  It would be best to take the dose about the same time each day.  If you have difficulty swallowing the tablet whole please discuss with your pharmacist how to take the medication safely.  Take Eliquis exactly as prescribed by your doctor and DO NOT stop taking Eliquis without talking to the doctor who prescribed the medication.  Stopping without other medication to take the place of Eliquis may increase your risk of developing a clot.  After discharge, you should have regular check-up appointments with your healthcare provider that is prescribing your Eliquis.  What do you do if you miss a dose? If a dose of ELIQUIS is not taken at the  scheduled time, take it as soon as  possible on the same day and twice-daily administration should be resumed.  The dose should not be doubled to make up for a missed dose.  Do not take more than one tablet of ELIQUIS at the same time.  Important Safety Information A possible side effect of Eliquis is bleeding. You should call your healthcare provider right away if you experience any of the following: ? Bleeding from an injury or your nose that does not stop. ? Unusual colored urine (red or dark brown) or unusual colored stools (red or black). ? Unusual bruising for unknown reasons. ? A serious fall or if you hit your head (even if there is no bleeding).  Some medicines may interact with Eliquis and might increase your risk of bleeding or clotting while on Eliquis. To help avoid this, consult your healthcare provider or pharmacist prior to using any new prescription or non-prescription medications, including herbals, vitamins, non-steroidal anti-inflammatory drugs (NSAIDs) and supplements.  This website has more information on Eliquis (apixaban): http://www.eliquis.com/eliquis/home

## 2015-03-20 NOTE — Progress Notes (Signed)
Occupational Therapy Evaluation Patient Details Name: Marilyn Hernandez MRN: [id number] DOB: 04-23-1950 Today's Date: 03/20/2015    History of Present Illness 65 y.o. female s/p Rt total knee arthroplasty. 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   Clinical Impression   Pt admitted with the above diagnoses and presents with below problem list. Pt will benefit from continued acute OT to address the below listed deficits and maximize independence with BADLs prior to d/c to venue below. PTA pt was independent with ADLs. Pt is currently min to max A with LB ADLs and transfers. Of note, pt with dizziness, nausea, "12/10" pain with movement, and c/o feeling "a little loopy" during session. Nursing notified. Pt needed cues for sequencing of movements during transfers this evaluation. OT to continue to follow acutely.      Follow Up Recommendations  SNF    Equipment Recommendations  Other (comment) (defer to next venue)    Recommendations for Other Services       Precautions / Restrictions Precautions Precautions: Knee Precaution Booklet Issued: Yes (comment) Precaution Comments: reviewed handout Required Braces or Orthoses: Knee Immobilizer - Right Restrictions Weight Bearing Restrictions: Yes RLE Weight Bearing: Weight bearing as tolerated      Mobility Bed Mobility Overal bed mobility: Needs Assistance Bed Mobility: Supine to Sit     Supine to sit: Min assist;HOB elevated     General bed mobility comments: Cues for technique. Assist to advance RLE. HOB elevated. Extra time needed. Used bed rail.   Transfers Overall transfer level: Needs assistance Equipment used: Rolling walker (2 wheeled) Transfers: Sit to/from [facility] Sit to Stand: Mod assist Stand pivot transfers: Min assist       General transfer comment: Mod A during powerup to standing. Cues for sequencing movements with rw during pivoting.  Pt c/o increased dizziness once EOB. Pt moves slow and guarded. Cues for technique with rw.    Balance Overall balance assessment: Needs assistance Sitting-balance support: No upper extremity supported;Feet supported Sitting balance-Leahy Scale: Fair Sitting balance - Comments: dizzy sitting EOB, BUE for most of EOB sitting   Standing balance support: Bilateral upper extremity supported Standing balance-Leahy Scale: Poor Standing balance comment: Pt reluctant to weight shift onto RLE.                            ADL Overall ADL's : Needs assistance/impaired Eating/Feeding: Set up;Sitting   Grooming: Set up;Sitting   Upper Body Bathing: Set up;Sitting   Lower Body Bathing: Maximal assistance;Sit to/from stand   Upper Body Dressing : Set up;Sitting   Lower Body Dressing: Maximal assistance;Sit to/from stand   Toilet Transfer: Minimal assistance;Stand-pivot;RW;BSC   Toileting- Clothing Manipulation and Hygiene: Set up;Sitting/lateral lean;Moderate assistance;Sit to/from Nurse, children's Details (indicate cue type and reason): unable to complete tub shower transfer this session Functional mobility during ADLs:  (SPT) General ADL Comments: Pt completed bed mobility and SPT to recliner with extra time and effort, cues for sequencing tasks. Pt reporting dizziness, nausea,pain, and "a little loopy" during session. Discussed LB ADL strategies.      Vision     Perception     Praxis      Pertinent Vitals/Pain Pain Assessment: 0-10 Pain Score: 10-Worst pain ever ("12" with RLE movement) Pain Location: Rt knee Pain Descriptors / Indicators: Aching;Grimacing Pain Intervention(s): Limited activity within patient's tolerance;Monitored during session;Repositioned;Ice applied;Utilized relaxation techniques  Hand Dominance Right   Extremity/Trunk Assessment Upper Extremity Assessment Upper Extremity Assessment: Overall WFL for tasks assessed   Lower  Extremity Assessment Lower Extremity Assessment: Defer to PT evaluation       Communication Communication Communication: No difficulties   Cognition Arousal/Alertness: Awake/alert Behavior During Therapy: WFL for tasks assessed/performed Overall Cognitive Status: No family/caregiver present to determine baseline cognitive functioning ("loopy") Area of Impairment: Problem solving             Problem Solving: Slow processing;Difficulty sequencing;Requires verbal cues     General Comments       Exercises       Shoulder Instructions      Home Living Family/patient expects to be discharged to:: Skilled nursing facility Living Arrangements: Spouse/significant other Available Help at Discharge: Family;Friend(s);Available PRN/intermittently Type of Home: House Home Access: Stairs to enter [employer] of Steps: 7 Entrance Stairs-Rails: Right;Left Home Layout: One level     Bathroom Shower/Tub: Tub/shower unit         Home Equipment: Environmental consultant - 2 wheels          Prior Functioning/Environment Level of Independence: Independent with assistive device(s)        Comments: sometimes using a cane to ambulate    OT Diagnosis: Acute pain   OT Problem List: Impaired balance (sitting and/or standing);Decreased activity tolerance;Decreased knowledge of use of DME or AE;Decreased knowledge of precautions;Pain   OT Treatment/Interventions: Self-care/ADL training;DME and/or AE instruction;Therapeutic activities;Patient/family education;Balance training    OT Goals(Current goals can be found in the care plan section) Acute Rehab OT Goals Patient Stated Goal: Go to rehab OT Goal Formulation: With patient Time For Goal Achievement: 03/27/15 Potential to Achieve Goals: Good ADL Goals Pt Will Perform Lower Body Bathing: with min guard assist;sit to/from stand;with adaptive equipment Pt Will Perform Lower Body Dressing: with min guard assist;with adaptive  equipment;sit to/from stand Pt Will Transfer to Toilet: with min guard assist;ambulating;bedside commode Pt Will Perform Toileting - Clothing Manipulation and hygiene: with min guard assist;sit to/from stand Additional ADL Goal #1: Pt will complete bed mobility at mod I level to prepare for OOB ADLs.  OT Frequency: Min 2X/week   Barriers to D/C:            Co-evaluation              End of Session Equipment Utilized During Treatment: Gait belt;Rolling walker;Right knee immobilizer CPM Right Knee CPM Right Knee: Off Additional Comments: bone foam on Nurse Communication: Mobility status;Other (comment) (dizzy, nausea, "loopy")  Activity Tolerance: Patient limited by pain;Other (comment) (dizzy, nausea) Patient left: in chair;with call bell/phone within reach   Time: 1020-1058 OT Time Calculation (min): 38 min Charges:  OT General Charges $OT Visit: 1 Procedure OT Evaluation $OT Eval Low Complexity: 1 Procedure OT Treatments $Self Care/Home Management : 8-22 mins G-Codes:    Hortencia Pilar 2015/03/21, 11:23 AM

## 2015-03-20 NOTE — Progress Notes (Signed)
Subjective: 1 Day Post-Op Procedure(s) (LRB): RIGHT TOTAL KNEE ARTHROPLASTY (Right) Patient reports pain as moderate.  Patient noted nausea with increased pain last night.  No vomiting.  The nausea has dissipated and the pain has subsided.  She also reports lightheadedness yesterday, but none this am.  No chest pain/sob.   Objective: Vital signs in last 24 hours: Temp:  [97.8 F (36.6 C)-99.4 F (37.4 C)] 99 F (37.2 C) (02/23 0420) Pulse Rate:  [45-92] 89 (02/23 0420) Resp:  [5-18] 16 (02/23 0420) BP: (105-152)/(51-86) 128/51 mmHg (02/23 0420) SpO2:  [92 %-100 %] 95 % (02/23 0420) Weight:  [96.344 kg (212 lb 6.4 oz)] 96.344 kg (212 lb 6.4 oz) (02/22 0905)  Intake/Output from previous day: 02/22 0701 - 02/23 0700 In: 2570 [P.O.:240; I.V.:2280; IV Piggyback:50] Out: 1050 [Urine:950; Blood:100] Intake/Output this shift:     Recent Labs  03/20/15 0520  HGB 9.7*    Recent Labs  03/20/15 0520  WBC 10.9*  RBC 3.69*  HCT 31.7*  PLT 303    Recent Labs  03/20/15 0520  NA 141  K 4.4  CL 106  CO2 25  BUN 14  CREATININE 0.96  GLUCOSE 146*  CALCIUM 8.5*   No results for input(s): LABPT, INR in the last 72 hours.  Neurologically intact Neurovascular intact Sensation intact distally Intact pulses distally Dorsiflexion/Plantar flexion intact Compartment soft  Assessment/Plan: 1 Day Post-Op Procedure(s) (LRB): RIGHT TOTAL KNEE ARTHROPLASTY (Right) Advance diet Up with therapy D/C IV fluids Discharge to SNF today or tomorrow pending insurance approval WBAT RLE ABLA-mild and stable Dry dressing change prn  Fannie Knee 03/20/2015, 7:03 AM

## 2015-03-20 NOTE — Clinical Social Work Note (Signed)
Clinical Social Work Assessment  Patient Details  Name: Marilyn Hernandez MRN: [id number] Date of Birth: Sep 03, 1950  Date of referral:  03/20/15               Reason for consult:  Facility Placement                Permission sought to share information with:  Facility Sport and exercise psychologist, Family Supports Permission granted to share information::  Yes, Verbal Permission Granted  Name::        Agency::     Relationship::     Contact Information:     Housing/Transportation Living arrangements for the past 2 months:  Single Family Home Source of Information:  Patient Patient Interpreter Needed:  None Criminal Activity/Legal Involvement Pertinent to Current Situation/Hospitalization:  No - Comment as needed Significant Relationships:  Adult Children, Spouse (Spouse Tynlee Hooey)) Lives with:  Spouse Do you feel safe going back to the place where you live?    Need for family participation in patient care:  No (Coment)  Care giving concerns:  Patient recently just had a knee replacement. PT recommend SNF   Social Worker assessment / plan:  BSW intern enters patient room. Patient is alert and oriented sitting up in bed. BSW intern explained PT recommendation and referral process. Patient states that she had arrangements already set up to go to [facility] in [place] when medically ready. Patient does wear hearing aids. Patient states that [facility] is close by her home. BSW intern call [facility] (Amy admission )  to confirm they do  have a bed waiting for her tomorrow when she is ready for discharge. Patient is in good spirit and is looking forward to a safe ans speedy recovery.   Employment status:  Disabled (Comment on whether or not currently receiving Disability) Insurance information:  Managed Medicare PT Recommendations:  [place] / Referral to community resources:  Acute Rehab  Patient/Family's Response to care:  Patient is agreeable to SNF.    Patient/Family's Understanding of and Emotional Response to Diagnosis, Current Treatment, and Prognosis:  Patient has good insight on current condition and current treatment plan.  Emotional Assessment Appearance:  Appears stated age Attitude/Demeanor/Rapport:   (welcoming, friendly) Affect (typically observed):  Accepting, Calm Orientation:  Oriented to Self, Oriented to Place, Oriented to  Time, Oriented to Situation Alcohol / Substance use:  Never Used Psych involvement (Current and /or in the community):  No (Comment)  Discharge Needs  Concerns to be addressed:  No discharge needs identified Readmission within the last 30 days:  No Current discharge risk:  None Barriers to Discharge:  No Barriers Identified   [place] intern  [phone]

## 2015-03-20 NOTE — NC FL2 (Signed)
[place] LEVEL OF CARE SCREENING TOOL     IDENTIFICATION  Patient Name: Marilyn Hernandez Birthdate: 04/21/1950 Sex: female Admission Date (Current Location): 03/19/2015  [place] and [place] Number:   ([place])   Facility and Address:  The [place] [place] [address] [place], [place] [address]      Provider Number: [id number]  Attending Physician Name and Address:  Ninetta Lights, MD  Relative Name and Phone Number:   Raechel Chute Chrissie Noa) [phone])    Current Level of Care: Hospital Recommended Level of Care: [place] Prior Approval Number:    Date Approved/Denied:   PASRR Number: [id number]  Discharge Plan: SNF    Current Diagnoses: Patient Active Problem List   Diagnosis Date Noted  . S/P total knee replacement 03/19/2015  . S/P laparoscopic appendectomy 10/06/2011  . Acute appendicitis 09/17/2011  . HTN (hypertension) 06/05/2010  . SYNCOPE, HX OF 12/23/2009  . Rheumatoid arthritis(714.0) 12/17/2009  . PLEURAL EFFUSION, LEFT 11/21/2009  . CHEST PAIN-PAINFUL RESPIRATION 11/21/2009  . HYPERCHOLESTEROLEMIA 11/03/2009  . CORONARY ATHEROSCLEROSIS NATIVE CORONARY ARTERY 11/03/2009  . FIBRILLATION, ATRIAL 11/03/2009  . CORONARY ARTERY BYPASS GRAFT, HX OF 11/03/2009    Orientation RESPIRATION BLADDER Height & Weight     Self, Time, Situation, Place  Normal Continent Weight: 212 lb 6.4 oz (96.344 kg) Height:  5' 8.5" (174 cm)  BEHAVIORAL SYMPTOMS/MOOD NEUROLOGICAL BOWEL NUTRITION STATUS      Continent Diet (regular)  AMBULATORY STATUS COMMUNICATION OF NEEDS Skin     Verbally Normal                       Personal Care Assistance Level of Assistance  Bathing, Feeding, Dressing Bathing Assistance: Maximum assistance Feeding assistance: Limited assistance Dressing Assistance: Maximum assistance     Functional Limitations Info  Sight, Hearing, Speech Sight Info: Adequate Hearing Info: Adequate Speech  Info: Adequate    SPECIAL CARE FACTORS FREQUENCY  PT (By licensed PT)     PT Frequency: 7X/week              Contractures      Additional Factors Info  Code Status, Allergies Code Status Info: note on file Allergies Info: Naproxen Sodium           Current Medications (03/20/2015):  This is the current hospital active medication list Current Facility-Administered Medications  Medication Dose Route Frequency Provider Last Rate Last Dose  . acetaminophen (TYLENOL) tablet 650 mg  650 mg Oral Q6H PRN Aundra Dubin, PA-C       Or  . acetaminophen (TYLENOL) suppository 650 mg  650 mg Rectal Q6H PRN Aundra Dubin, PA-C      . alum & mag hydroxide-simeth (MAALOX/MYLANTA) 200-200-20 MG/5ML suspension 30 mL  30 mL Oral Q4H PRN Aundra Dubin, PA-C      . apixaban Arne [employer]) tablet 2.5 mg  2.5 mg Oral Q12H Aundra Dubin, PA-C   2.5 mg at 03/20/15 0741  . bisacodyl (DULCOLAX) suppository 10 mg  10 mg Rectal Daily PRN Aundra Dubin, PA-C      . diazepam (VALIUM) tablet 2 mg  2 mg Oral Q8H PRN Aundra Dubin, PA-C   2 mg at 03/19/15 1611  . diphenhydrAMINE (BENADRYL) 12.5 MG/5ML elixir 12.5-25 mg  12.5-25 mg Oral Q4H PRN Aundra Dubin, PA-C      . docusate sodium (COLACE) capsule 100 mg  100 mg Oral BID Aundra Dubin,  PA-C   100 mg at 03/20/15 0741  . folic acid (FOLVITE) tablet 0.5 mg  0.5 mg Oral Daily Ninetta Lights, MD   0.5 mg at 03/20/15 0741  . furosemide (LASIX) tablet 20 mg  20 mg Oral BID PRN Aundra Dubin, PA-C      . HYDROmorphone (DILAUDID) injection 0.5-1 mg  0.5-1 mg Intravenous Q2H PRN Aundra Dubin, PA-C   1 mg at 03/20/15 0736  . menthol-cetylpyridinium (CEPACOL) lozenge 3 mg  1 lozenge Oral PRN Aundra Dubin, PA-C       Or  . phenol (CHLORASEPTIC) mouth spray 1 spray  1 spray Mouth/Throat PRN Aundra Dubin, PA-C      . metoCLOPramide (REGLAN) tablet 5-10 mg  5-10 mg Oral Q8H PRN Aundra Dubin, PA-C       Or  . metoCLOPramide (REGLAN) injection  5-10 mg  5-10 mg Intravenous Q8H PRN Aundra Dubin, PA-C      . metoprolol tartrate (LOPRESSOR) tablet 12.5 mg  12.5 mg Oral BID Aundra Dubin, PA-C   12.5 mg at 03/20/15 0744  . nitroGLYCERIN (NITROSTAT) SL tablet 0.4 mg  0.4 mg Sublingual Q5 min PRN Aundra Dubin, PA-C      . ondansetron [facility]) tablet 4 mg  4 mg Oral Q6H PRN Aundra Dubin, PA-C       Or  . ondansetron [employer]) injection 4 mg  4 mg Intravenous Q6H PRN Aundra Dubin, PA-C   4 mg at 03/19/15 2020  . oxyCODONE (Oxy IR/ROXICODONE) immediate release tablet 5-10 mg  5-10 mg Oral Q3H PRN Aundra Dubin, PA-C   10 mg at 03/20/15 0827  . polyethylene glycol (MIRALAX / GLYCOLAX) packet 17 g  17 g Oral Daily PRN Aundra Dubin, PA-C      . potassium chloride (K-DUR,KLOR-CON) CR tablet 10 mEq  10 mEq Oral Daily PRN Aundra Dubin, PA-C      . rosuvastatin (CRESTOR) tablet 40 mg  40 mg Oral Daily Aundra Dubin, PA-C   40 mg at 03/19/15 2254  . sodium phosphate (FLEET) 7-19 GM/118ML enema 1 enema  1 enema Rectal Once PRN Aundra Dubin, PA-C      . vitamin B-12 (CYANOCOBALAMIN) tablet 1,000 mcg  1,000 mcg Oral Daily Aundra Dubin, PA-C   1,000 mcg at 03/20/15 0744  . zolpidem (AMBIEN) tablet 5 mg  5 mg Oral QHS PRN Aundra Dubin, PA-C         Discharge Medications: Please see discharge summary for a list of discharge medications.  Relevant Imaging Results:  Relevant Lab Results:   Additional Information SS# [id number]  Minta Balsam  BSW intern  [phone]

## 2015-03-21 LAB — CBC
HCT: 29.3 % — ABNORMAL LOW (ref 36.0–46.0)
Hemoglobin: 9.5 g/dL — ABNORMAL LOW (ref 12.0–15.0)
MCH: 28.2 pg (ref 26.0–34.0)
MCHC: 32.4 g/dL (ref 30.0–36.0)
MCV: 86.9 fL (ref 78.0–100.0)
PLATELETS: 253 10*3/uL (ref 150–400)
RBC: 3.37 MIL/uL — ABNORMAL LOW (ref 3.87–5.11)
RDW: 15.8 % — AB (ref 11.5–15.5)
WBC: 17.3 10*3/uL — ABNORMAL HIGH (ref 4.0–10.5)

## 2015-03-21 LAB — BASIC METABOLIC PANEL
Anion gap: 7 (ref 5–15)
BUN: 16 mg/dL (ref 6–20)
CO2: 26 mmol/L (ref 22–32)
CREATININE: 1.05 mg/dL — AB (ref 0.44–1.00)
Calcium: 9 mg/dL (ref 8.9–10.3)
Chloride: 106 mmol/L (ref 101–111)
GFR calc Af Amer: >60 mL/min (ref >60)
GFR, EST NON AFRICAN AMERICAN: 54 mL/min — AB (ref >60)
Glucose, Bld: 131 mg/dL — ABNORMAL HIGH (ref 65–99)
Potassium: 4.2 mmol/L (ref 3.5–5.1)
SODIUM: 139 mmol/L (ref 135–145)

## 2015-03-21 NOTE — Progress Notes (Signed)
Physical Therapy Treatment Patient Details Name: Marilyn Hernandez MRN: [id number] DOB: Jul 28, 1950 Today's Date: 03/21/2015    History of Present Illness 65 y.o. female s/p Rt total knee arthroplasty. 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    PT Comments    Patient is progressing slowly toward mobility goals and is eager to participate in therapy. Continue to progress as tolerated with anticipated d/c to SNF for further skilled PT services.    Follow Up Recommendations  SNF;Supervision for mobility/OOB     Equipment Recommendations  None recommended by PT    Recommendations for Other Services       Precautions / Restrictions Precautions Precautions: Knee Precaution Booklet Issued: Yes (comment) Precaution Comments: reviewed handout Required Braces or Orthoses: Knee Immobilizer - Right Restrictions Weight Bearing Restrictions: Yes RLE Weight Bearing: Weight bearing as tolerated    Mobility  Bed Mobility Overal bed mobility: Needs Assistance Bed Mobility: Supine to Sit     Supine to sit: Min guard;HOB elevated     General bed mobility comments: min guard for safety; increased time and vc for sequencing; min use of bed rail but no physical assist needed  Transfers Overall transfer level: Needs assistance Equipment used: Rolling walker (2 wheeled) Transfers: Sit to/from Stand Sit to Stand: Mod assist         General transfer comment: assist to stand from EOB and increased time to transition hands to RW; vc for hand placement   Ambulation/Gait Ambulation/Gait assistance: Min guard Ambulation Distance (Feet): 85 Feet Assistive device: Rolling walker (2 wheeled) Gait Pattern/deviations: Step-to pattern;Decreased stance time - right;Decreased step length - left;Antalgic;Trunk flexed   Gait velocity interpretation: Below normal speed for age/gender General Gait Details: cues for position of RW, sequencing of  gait with use of AD, and posture; pt with improved gait this session and able to tolerate increased WB on R LE   Stairs            Wheelchair Mobility    Modified Rankin (Stroke Patients Only)       Balance Overall balance assessment: Needs assistance Sitting-balance support: No upper extremity supported;Feet supported Sitting balance-Leahy Scale: Good     Standing balance support: Bilateral upper extremity supported Standing balance-Leahy Scale: Fair                      Cognition Arousal/Alertness: Awake/alert Behavior During Therapy: WFL for tasks assessed/performed Overall Cognitive Status: Within Functional Limits for tasks assessed                      Exercises Total Joint Exercises Quad Sets: AROM;Both;10 reps;Seated Heel Slides: AROM;AAROM;Right;10 reps;Seated Straight Leg Raises: AROM;Right;5 reps;Seated Knee Flexion: AROM;Right;5 reps;Seated Goniometric ROM: 5-55    General Comments        Pertinent Vitals/Pain Pain Assessment: 0-10 Pain Score: 4  Pain Location: R knee with mobility Pain Descriptors / Indicators: Sore Pain Intervention(s): Limited activity within patient's tolerance;Monitored during session;Premedicated before session;Repositioned    Home Living                      Prior Function            PT Goals (current goals can now be found in the care plan section) Acute Rehab PT Goals Patient Stated Goal: Go to rehab Progress towards PT goals: Progressing toward goals    Frequency  7X/week    PT  Plan Current plan remains appropriate    Co-evaluation             End of Session Equipment Utilized During Treatment: Gait belt;Right knee immobilizer Activity Tolerance: Patient limited by pain Patient left: in chair;with call bell/phone within reach;Other (comment) (zero degree foam)     Time: [id number] PT Time Calculation (min) (ACUTE ONLY): 42 min  Charges:  $Gait Training: 8-22  mins $Therapeutic Exercise: 8-22 mins $Therapeutic Activity: 8-22 mins                    G Codes:      [place]ina April, PTA Pager: [phone]   03/21/2015, 9:56 AM

## 2015-03-21 NOTE — Progress Notes (Signed)
Ned Card discharged SNF per MD order. All questions and concerns answered. IV removed. Report to [place] at [facility].  Patient escorted by [facility] No distress noted upon discharge.   Tarri Abernethy R 03/21/2015 12:02 PM

## 2015-03-21 NOTE — Progress Notes (Signed)
Subjective: 2 Days Post-Op Procedure(s) (LRB): RIGHT TOTAL KNEE ARTHROPLASTY (Right) Patient reports pain as mild.  Patient reports nausea and vomiting yesterday.  None today.  No chest pain/sob, lightheadedness/dizziness.  Positive flatus but no bm.  Tolerating diet.  Objective: Vital signs in last 24 hours: Temp:  [98.4 F (36.9 C)-98.7 F (37.1 C)] 98.4 F (36.9 C) (02/24 0442) Pulse Rate:  [67-69] 67 (02/24 0442) Resp:  [16-18] 16 (02/24 0442) BP: (128-129)/(60-72) 129/64 mmHg (02/24 0442) SpO2:  [95 %-100 %] 100 % (02/24 0442)  Intake/Output from previous day: 02/23 0701 - 02/24 0700 In: 240 [P.O.:240] Out: 2 [Emesis/NG output:2] Intake/Output this shift:     Recent Labs  03/20/15 0520 03/21/15 0531  HGB 9.7* 9.5*    Recent Labs  03/20/15 0520 03/21/15 0531  WBC 10.9* 17.3*  RBC 3.69* 3.37*  HCT 31.7* 29.3*  PLT 303 253    Recent Labs  03/20/15 0520 03/21/15 0531  NA 141 139  K 4.4 4.2  CL 106 106  CO2 25 26  BUN 14 16  CREATININE 0.96 1.05*  GLUCOSE 146* 131*  CALCIUM 8.5* 9.0   No results for input(s): LABPT, INR in the last 72 hours.  Neurologically intact Neurovascular intact Sensation intact distally Intact pulses distally Dorsiflexion/Plantar flexion intact Incision: scant drainage No cellulitis present Compartment soft  Dressing changed by me today  Assessment/Plan: 2 Days Post-Op Procedure(s) (LRB): RIGHT TOTAL KNEE ARTHROPLASTY (Right) Advance diet Up with therapy Discharge to SNF today or as soon as insurance approves WBAT RLE ABLA-mild and stable Please place ted hose RLE  Fannie Knee 03/21/2015, 8:14 AM

## 2015-03-21 NOTE — Clinical Social Work Note (Addendum)
Patient will discharge today per MD order. Patient will discharge to: [facility] Room 207A RN to call report prior to transportation to: [phone]- RN Melody  Transportation: [facility]- to be called at 12pm  CSW sent discharge summary to SNF for review.  RN, patient and SNF are agreeable to the discharge plan.   Nonnie Done, LCSW [phone]  5N1-9, 2S 15-16 and Psychiatric Service Line  Licensed Clinical Social Worker

## 2015-03-26 ENCOUNTER — Encounter
Admission: RE | Admit: 2015-03-26 | Discharge: 2015-03-26 | Disposition: A | Discharge status: Home / Self Care | Payer: Medicare Other | Source: Ambulatory Visit | Attending: Internal Medicine | Admitting: Internal Medicine

## 2015-03-26 DIAGNOSIS — I10 Essential (primary) hypertension: Secondary | ICD-10-CM | POA: Insufficient documentation

## 2015-03-26 DIAGNOSIS — D649 Anemia, unspecified: Secondary | ICD-10-CM | POA: Insufficient documentation

## 2015-03-27 DIAGNOSIS — I10 Essential (primary) hypertension: Secondary | ICD-10-CM | POA: Diagnosis present

## 2015-03-27 DIAGNOSIS — D649 Anemia, unspecified: Secondary | ICD-10-CM | POA: Diagnosis present

## 2015-03-27 LAB — BASIC METABOLIC PANEL
Anion gap: 7 (ref 5–15)
BUN: 15 mg/dL (ref 6–20)
CHLORIDE: 107 mmol/L (ref 101–111)
CO2: 28 mmol/L (ref 22–32)
CREATININE: 0.9 mg/dL (ref 0.44–1.00)
Calcium: 9.2 mg/dL (ref 8.9–10.3)
GFR calc non Af Amer: >60 mL/min (ref >60)
GLUCOSE: 100 mg/dL — AB (ref 65–99)
Potassium: 4.4 mmol/L (ref 3.5–5.1)
Sodium: 142 mmol/L (ref 135–145)

## 2015-03-27 LAB — CBC WITH DIFFERENTIAL/PLATELET
BASOS PCT: 0 %
Basophils Absolute: 0 10*3/uL (ref 0–0.1)
Eosinophils Absolute: 0.2 10*3/uL (ref 0–0.7)
Eosinophils Relative: 2 %
HEMATOCRIT: 30.4 % — AB (ref 35.0–47.0)
HEMOGLOBIN: 10.1 g/dL — AB (ref 12.0–16.0)
LYMPHS ABS: 1.7 10*3/uL (ref 1.0–3.6)
LYMPHS PCT: 19 %
MCH: 27.5 pg (ref 26.0–34.0)
MCHC: 33.2 g/dL (ref 32.0–36.0)
MCV: 82.8 fL (ref 80.0–100.0)
MONOS PCT: 11 %
Monocytes Absolute: 1 10*3/uL — ABNORMAL HIGH (ref 0.2–0.9)
NEUTROS ABS: 6.3 10*3/uL (ref 1.4–6.5)
NEUTROS PCT: 68 %
Platelets: 481 10*3/uL — ABNORMAL HIGH (ref 150–440)
RBC: 3.67 MIL/uL — ABNORMAL LOW (ref 3.80–5.20)
RDW: 16.1 % — ABNORMAL HIGH (ref 11.5–14.5)
WBC: 9.2 10*3/uL (ref 3.6–11.0)

## 2015-04-04 ENCOUNTER — Ambulatory Visit (INDEPENDENT_AMBULATORY_CARE_PROVIDER_SITE_OTHER): Payer: Medicare Other | Admitting: Cardiology

## 2015-04-04 ENCOUNTER — Encounter: Payer: Self-pay | Admitting: Cardiology

## 2015-04-04 VITALS — BP 140/78 | HR 76 | Ht 69.5 in | Wt 212.2 lb

## 2015-04-04 DIAGNOSIS — I82401 Acute embolism and thrombosis of unspecified deep veins of right lower extremity: Secondary | ICD-10-CM

## 2015-04-04 NOTE — Patient Instructions (Signed)
Your physician recommends that you continue on your current medications as directed. Please refer to the Current Medication list given to you today.  Dr Percival Spanish recommends that you schedule a follow-up appointment in 2 weeks.  If you need a refill on your cardiac medications before your next appointment, please call your pharmacy.

## 2015-04-04 NOTE — Progress Notes (Signed)
HPI Mrs. Ned Card presents for evaluation of a DVT.  She has been at rehab.  She was added to my schedule today.  She apparently had a lower extremity Doppler on Wed and was found to have a DVT.  (I do not have and cannot at this time get access to that report.)  She was on 2.5 mg of  Eliquis.  She has had knee pain throughout her rehab.  She is doing PT.  She complained of some right anterior tibial pain.  She is now on 5 mg BID on Eliquis.  She denies any cardiovascular symptoms.   The patient denies any new symptoms such as chest discomfort, neck or arm discomfort. There has been no new shortness of breath, PND or orthopnea. There have been no reported palpitations, presyncope or syncope.   Allergies  Allergen Reactions  . Naproxen Sodium Itching and Rash    Current Outpatient Prescriptions  Medication Sig Dispense Refill  . acetaminophen (TYLENOL) 325 MG tablet Take 325 mg by mouth every 6 (six) hours as needed.    Marland Kitchen apixaban (ELIQUIS) 2.5 MG TABS tablet Take 1 tab po q12 hours x 14 days following surgery to prevent blood clots 28 tablet 0  . bisacodyl (DULCOLAX) 5 MG EC tablet Take 1 tablet (5 mg total) by mouth daily as needed for moderate constipation. 30 tablet 0  . Cholecalciferol (VITAMIN D3) 2000 UNITS TABS Take 1 capsule by mouth daily.      . Coenzyme Q10 (CO Q 10) 100 MG CAPS Take 100 mg by mouth at bedtime.     . folic acid (FOLVITE) Q000111Q MCG tablet Take 400 mcg by mouth daily.      . furosemide (LASIX) 20 MG tablet Take 1 tablet (20 mg total) by mouth as needed. (Patient taking differently: Take 20 mg by mouth 2 (two) times daily as needed for fluid. ) 30 tablet 11  . MAGNESIUM CITRATE PO Take 1 tablet by mouth daily.    . methocarbamol (ROBAXIN) 500 MG tablet Take 500 mg by mouth every 6 (six) hours as needed for muscle spasms.    . metoprolol tartrate (LOPRESSOR) 25 MG tablet Take 0.5 tablets (12.5 mg total) by mouth 2 (two) times daily. 30 tablet 11  . nitroGLYCERIN  (NITROSTAT) 0.4 MG SL tablet PLACE 1 TABLET UNDER TONGUE EVERY 5 MINUTES IF NEEDED FOR CHEST PAIN 25 tablet 3  . ondansetron (ZOFRAN) 4 MG tablet Take 1 tablet (4 mg total) by mouth every 8 (eight) hours as needed for nausea or vomiting. 40 tablet 0  . oxyCODONE-acetaminophen (ROXICET) 5-325 MG tablet Take 1-2 tablets by mouth every 4 (four) hours as needed. 60 tablet 0  . potassium chloride (KLOR-CON M10) 10 MEQ tablet TAKE 1 TABLET BY MOUTH EVERY DAY (Patient taking differently: Take 10 mEq by mouth daily as needed (with 20 mg lasix). TAKE 1 TABLET BY MOUTH EVERY DAY) 30 tablet 11  . rosuvastatin (CRESTOR) 40 MG tablet Take 1 tablet (40 mg total) by mouth daily. 30 tablet 11  . vitamin B-12 (CYANOCOBALAMIN) 1000 MCG tablet Take 1,000 mcg by mouth daily. With folic acid [id number] mcg     . [DISCONTINUED] potassium chloride (K-DUR) 10 MEQ tablet TAKE 1 TABLET BY MOUTH EVERY DAY 30 tablet 2   No current facility-administered medications for this visit.    Past Medical History  Diagnosis Date  . Hyperlipidemia   . Coronary artery disease     s/p NSTEMI 09/2009; s/p CABG  09/2009 [facility] [place] [place]echo 12/12/2009: EF 55-60%, normal RVF  . Pleural effusion 09/2009    post CABG  . Pulmonary nodules   . Hypertension   . URI (upper respiratory infection)     hx  . GERD (gastroesophageal reflux disease)     occ  . 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 Complication of anesthesia     "took me a little longer to wakeup in 1979"  . COPD (chronic obstructive pulmonary disease) ([place])     denies 03/19/2015  . Anemia 1989    "before hysterectomy"  . RA (rheumatoid arthritis) ([place])     "took strong nutrition; working well for me" (03/19/2015)  . History of gout   . NSTEMI (non-ST elevated myocardial infarction) ([place]) 09/2009    Past Surgical History  Procedure Laterality Date  . Cesarean section  1977; 1979  . Knee arthroscopy Bilateral     left; right; right  . Stapedectomy Right 1996  . Laparoscopic  appendectomy  09/16/2011    Procedure: APPENDECTOMY LAPAROSCOPIC;  Surgeon: Zenovia Jarred, MD;  Location: [place];  Service: General;  Laterality: N/A;  . Appendectomy    . Total knee arthroplasty Right 03/19/2015  . Joint replacement    . Eye surgery      eye procedure laser rupt vessels  . Colonoscopy  05/1999    video/notes 06/08/2010  . Abdominal hysterectomy  1989  . Coronary artery bypass graft  09/2009    LIMA to the LAD, SVG right coronary artery, SVG to obtuse marginal September 2011   . Cardiac catheterization  09/2009  . Coronary angioplasty    . Total knee arthroplasty Right 03/19/2015    Procedure: RIGHT TOTAL KNEE ARTHROPLASTY;  Surgeon: Ninetta Lights, MD;  Location: [place];  Service: Orthopedics;  Laterality: Right;    ROS:  As stated in the HPI and negative for all other systems.  PHYSICAL EXAM BP 140/78 mmHg  Pulse 76  Ht 5' 9.5" (1.765 m)  Wt 212 lb 4 oz (96.276 kg)  BMI 30.90 kg/m2 GENERAL:  Well appearing NECK:  No jugular venous distention, waveform within normal limits, carotid upstroke brisk and symmetric, no bruits, no thyromegaly LYMPHATICS:  No cervical, inguinal adenopathy LUNGS:  Clear to auscultation bilaterally CHEST:  Well healed sternotomy scar. HEART:  PMI not displaced or sustained,S1 and S2 within normal limits, no S3, no S4, no clicks, no rubs, no murmurs ABD:  Flat, positive bowel sounds normal in frequency in pitch, no bruits, no rebound, no guarding, no midline pulsatile mass, no hepatomegaly, no splenomegaly EXT:  2 plus pulses throughout, no edema, no cyanosis no clubbing, right leg no  EKG:   Sinus rhythm, rate 76, incomplete right bundle branch block , no acute ST-T wave changes.  04/04/2015   ASSESSMENT AND PLAN  DVT The patient was on a lower dose of Eliquis than would be suggested based on age, weight and creat.  I do not think that her DVT is a failure of Eliquis.  She is now on therapeutic dose Eliquis.  She will continue on this.  I  will have her come back in a couple of weeks.  She will call if pain or swelling gets worse rather than better.     CORONARY ARTERY BYPASS GRAFT, HX OF -  The patient has no new sypmtoms. No further cardiovascular testing is indicated. We will continue with aggressive risk reduction and meds as listed.  HTN (hypertension) -  The blood pressure is at target. No change in medications is indicated. We will continue with therapeutic lifestyle changes (TLC).   HYPERCHOLESTEROLEMIA -  She reports that this is followed by [facility], MD.  She will continue with meds as listed.

## 2015-04-07 ENCOUNTER — Encounter: Payer: Self-pay | Admitting: Cardiology

## 2015-04-07 ENCOUNTER — Encounter (HOSPITAL_COMMUNITY): Payer: Medicare Other

## 2015-04-07 DIAGNOSIS — I82409 Acute embolism and thrombosis of unspecified deep veins of unspecified lower extremity: Secondary | ICD-10-CM | POA: Insufficient documentation

## 2015-04-14 ENCOUNTER — Ambulatory Visit: Payer: Medicare Other | Admitting: Cardiology

## 2015-04-24 ENCOUNTER — Ambulatory Visit (INDEPENDENT_AMBULATORY_CARE_PROVIDER_SITE_OTHER): Payer: Medicare Other | Admitting: Cardiology

## 2015-04-24 ENCOUNTER — Encounter: Payer: Self-pay | Admitting: Cardiology

## 2015-04-24 VITALS — BP 144/82 | HR 77 | Ht 69.5 in | Wt 212.0 lb

## 2015-04-24 DIAGNOSIS — I82401 Acute embolism and thrombosis of unspecified deep veins of right lower extremity: Secondary | ICD-10-CM | POA: Diagnosis not present

## 2015-04-24 NOTE — Patient Instructions (Signed)
Your physician recommends that you schedule a follow-up appointment in: After Doppler  Your physician has requested that you have a lower extremity arterial duplex. This test is an ultrasound of the arteries in the legs or arms. It looks at arterial blood flow in the legs and arms. Allow one hour for Lower and Upper Arterial scans. There are no restrictions or special instructions In May

## 2015-04-24 NOTE — Progress Notes (Signed)
HPI [name] presents for evaluation of a DVT.   This was discovered on the seventh. She had knee surgery and was having rehabilitation had some leg pain. She was on 2-1/2 mg about this at that time and since has been treated with 5 mg twice a day. She says her pain is improving. The swelling is improving. She is doing her physical therapy. She's had no shortness of breath or chest pain. SShe denies any cardiovascular symptoms.   The patient denies any new symptoms such as chest discomfort, neck or arm discomfort. There has been no new shortness of breath, PND or orthopnea. There have been no reported palpitations, presyncope or syncope.   Allergies  Allergen Reactions  . Naproxen Sodium Itching and Rash    Current Outpatient Prescriptions  Medication Sig Dispense Refill  . acetaminophen (TYLENOL) 325 MG tablet Take 325 mg by mouth every 6 (six) hours as needed.    Marland Kitchen apixaban (ELIQUIS) 5 MG TABS tablet Take 5 mg by mouth 2 (two) times daily.    . bisacodyl (DULCOLAX) 5 MG EC tablet Take 1 tablet (5 mg total) by mouth daily as needed for moderate constipation. 30 tablet 0  . Cholecalciferol (VITAMIN D3) 2000 UNITS TABS Take 1 capsule by mouth daily.      . Coenzyme Q10 (CO Q 10) 100 MG CAPS Take 100 mg by mouth at bedtime.     . folic acid (FOLVITE) Q000111Q MCG tablet Take 400 mcg by mouth daily.      . furosemide (LASIX) 20 MG tablet Take 1 tablet (20 mg total) by mouth as needed. (Patient taking differently: Take 20 mg by mouth daily as needed for fluid. ) 30 tablet 11  . MAGNESIUM CITRATE PO Take 1 tablet by mouth daily.    . methocarbamol (ROBAXIN) 500 MG tablet Take 500 mg by mouth every 6 (six) hours as needed for muscle spasms.    . metoprolol tartrate (LOPRESSOR) 25 MG tablet Take 0.5 tablets (12.5 mg total) by mouth 2 (two) times daily. 30 tablet 11  . nitroGLYCERIN (NITROSTAT) 0.4 MG SL tablet PLACE 1 TABLET UNDER TONGUE EVERY 5 MINUTES IF NEEDED FOR CHEST PAIN 25 tablet 3    . ondansetron (ZOFRAN) 4 MG tablet Take 1 tablet (4 mg total) by mouth every 8 (eight) hours as needed for nausea or vomiting. 40 tablet 0  . oxyCODONE-acetaminophen (ROXICET) 5-325 MG tablet Take 1-2 tablets by mouth every 4 (four) hours as needed. 60 tablet 0  . potassium chloride (KLOR-CON M10) 10 MEQ tablet TAKE 1 TABLET BY MOUTH EVERY DAY (Patient taking differently: Take 10 mEq by mouth daily as needed (with 20 mg lasix). TAKE 1 TABLET BY MOUTH EVERY DAY) 30 tablet 11  . rosuvastatin (CRESTOR) 40 MG tablet Take 1 tablet (40 mg total) by mouth daily. 30 tablet 11  . vitamin B-12 (CYANOCOBALAMIN) 1000 MCG tablet Take 1,000 mcg by mouth daily. With folic acid [id number] mcg     . [DISCONTINUED] potassium chloride (K-DUR) 10 MEQ tablet TAKE 1 TABLET BY MOUTH EVERY DAY 30 tablet 2   No current facility-administered medications for this visit.    Past Medical History  Diagnosis Date  . Hyperlipidemia   . Coronary artery disease     s/p NSTEMI 09/2009; s/p CABG 09/2009 [facility] [place]);echo 12/12/2009: EF 55-60%, normal RVF  . Pleural effusion 09/2009    post CABG  . Pulmonary nodules   . Hypertension   . URI (upper  respiratory infection)     hx  . GERD (gastroesophageal reflux disease)     occ  . 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 Complication of anesthesia     "took me a little longer to wakeup in 1979"  . COPD (chronic obstructive pulmonary disease) ([place])     denies 03/19/2015  . Anemia 1989    "before hysterectomy"  . RA (rheumatoid arthritis) ([place])     "took strong nutrition; working well for me" (03/19/2015)  . History of gout   . NSTEMI (non-ST elevated myocardial infarction) ([place]) 09/2009    Past Surgical History  Procedure Laterality Date  . Cesarean section  1977; 1979  . Knee arthroscopy Bilateral     left; right; right  . Stapedectomy Right 1996  . Laparoscopic appendectomy  09/16/2011    Procedure: APPENDECTOMY LAPAROSCOPIC;  Surgeon: Zenovia Jarred, MD;  Location:  [facility];  Service: General;  Laterality: N/A;  . Appendectomy    . Total knee arthroplasty Right 03/19/2015  . Joint replacement    . Eye surgery      eye procedure laser rupt vessels  . Colonoscopy  05/1999    video/notes 06/08/2010  . Abdominal hysterectomy  1989  . Coronary artery bypass graft  09/2009    LIMA to the LAD, SVG right coronary artery, SVG to obtuse marginal September 2011   . Cardiac catheterization  09/2009  . Coronary angioplasty    . Total knee arthroplasty Right 03/19/2015    Procedure: RIGHT TOTAL KNEE ARTHROPLASTY;  Surgeon: Ninetta Lights, MD;  Location: [facility];  Service: Orthopedics;  Laterality: Right;    ROS:  As stated in the HPI and negative for all other systems.  PHYSICAL EXAM BP 144/82 mmHg  Pulse 77  Ht 5' 9.5" (1.765 m)  Wt 212 lb (96.163 kg)  BMI 30.87 kg/m2 GENERAL:  Well appearing NECK:  No jugular venous distention, waveform within normal limits, carotid upstroke brisk and symmetric, no bruits, no thyromegaly LYMPHATICS:  No cervical, inguinal adenopathy LUNGS:  Clear to auscultation bilaterally CHEST:  Well healed sternotomy scar. HEART:  PMI not displaced or sustained,S1 and S2 within normal limits, no S3, no S4, no clicks, no rubs, no murmurs ABD:  Flat, positive bowel sounds normal in frequency in pitch, no bruits, no rebound, no guarding, no midline pulsatile mass, no hepatomegaly, no splenomegaly EXT:  2 plus pulses throughout, mild right greater than left edema improved edema, no cyanosis no clubbing, no cord or calf tenderness    ASSESSMENT AND PLAN  DVT She's doing well. I will continue with anticoagulation. I'll check arterial Doppler in May. She'll continue her anticoagulation for at least 3 months area  CORONARY ARTERY BYPASS GRAFT, HX OF -  The patient has no new sypmtoms. No further cardiovascular testing is indicated. We will continue with aggressive risk reduction and meds as listed.     HTN (hypertension) -  The blood  pressure is at target. No change in medications is indicated. We will continue with therapeutic lifestyle changes (TLC).   HYPERCHOLESTEROLEMIA -  She reports that this is followed by [facility], MD.  She will continue with meds as listed.

## 2015-04-25 ENCOUNTER — Telehealth: Payer: Self-pay | Admitting: Cardiology

## 2015-04-25 MED ORDER — ONDANSETRON HCL 4 MG PO TABS: 4.0000 mg | ORAL_TABLET | Freq: Three times a day (TID) | ORAL | Status: DC | PRN

## 2015-04-25 NOTE — Telephone Encounter (Signed)
New message      *STAT* If patient is at the pharmacy, call can be transferred to refill team.   1. Which medications need to be refilled? (please list name of each medication and dose if known) zofran 4mg  2. Which pharmacy/location (including street and city if local pharmacy) [facility] [place]  3. Do they need a 30 day or 90 day supply? 30 day supply----please call and let her know presc has been called in  And; patient states she is waiting on someone to call and schedule a rt leg doppler for DVT.  Please call her cell

## 2015-04-25 NOTE — Telephone Encounter (Signed)
Refilled med, msg sent to scheduling, informed patient via VM.

## 2015-04-25 NOTE — Telephone Encounter (Signed)
OK to prescribe Zofran 4 mg Q8 hours prn nausea with 20 pills and one refill.

## 2015-04-25 NOTE — Telephone Encounter (Signed)
Patient states zofran refill was discussed at yesterday's appt - she was requesting to get this medication. She is aware I will seek OK from Dr. Percival Spanish. Staff msg sent to request scheduling the [place] for DVT r/o.

## 2015-04-28 ENCOUNTER — Other Ambulatory Visit: Payer: Self-pay | Admitting: *Deleted

## 2015-04-28 DIAGNOSIS — I82403 Acute embolism and thrombosis of unspecified deep veins of lower extremity, bilateral: Secondary | ICD-10-CM

## 2015-04-28 DIAGNOSIS — I82401 Acute embolism and thrombosis of unspecified deep veins of right lower extremity: Secondary | ICD-10-CM

## 2015-04-29 ENCOUNTER — Other Ambulatory Visit: Payer: Self-pay | Admitting: Cardiology

## 2015-04-29 DIAGNOSIS — I82401 Acute embolism and thrombosis of unspecified deep veins of right lower extremity: Secondary | ICD-10-CM

## 2015-05-26 ENCOUNTER — Telehealth: Payer: Self-pay | Admitting: Cardiology

## 2015-05-26 ENCOUNTER — Telehealth: Payer: Self-pay

## 2015-05-26 NOTE — Telephone Encounter (Signed)
Pt called requesting samples of Eliquis 5 mg. I left some up front for her

## 2015-05-26 NOTE — Telephone Encounter (Signed)
Patient calling the office for samples of medication: ° ° °1.  What medication and dosage are you requesting samples for? °Eliquis 5 mg  °2.  Are you currently out of this medication?  °Yes ° ° ° °

## 2015-06-04 ENCOUNTER — Ambulatory Visit (HOSPITAL_COMMUNITY)
Admission: RE | Admit: 2015-06-04 | Discharge: 2015-06-04 | Disposition: A | Discharge status: Home / Self Care | Payer: Medicare Other | Source: Ambulatory Visit | Attending: Cardiovascular Disease | Admitting: Cardiovascular Disease

## 2015-06-04 DIAGNOSIS — E785 Hyperlipidemia, unspecified: Secondary | ICD-10-CM | POA: Diagnosis not present

## 2015-06-04 DIAGNOSIS — I10 Essential (primary) hypertension: Secondary | ICD-10-CM | POA: Diagnosis not present

## 2015-06-04 DIAGNOSIS — I82401 Acute embolism and thrombosis of unspecified deep veins of right lower extremity: Secondary | ICD-10-CM | POA: Diagnosis not present

## 2015-06-04 DIAGNOSIS — K219 Gastro-esophageal reflux disease without esophagitis: Secondary | ICD-10-CM | POA: Insufficient documentation

## 2015-06-09 ENCOUNTER — Telehealth: Payer: Self-pay | Admitting: Cardiology

## 2015-06-09 NOTE — Telephone Encounter (Signed)
Pt aware of doppler

## 2015-06-09 NOTE — Telephone Encounter (Signed)
New message ° ° ° ° °Returning a call to the nurse °

## 2015-07-01 ENCOUNTER — Telehealth: Payer: Self-pay | Admitting: *Deleted

## 2015-07-01 NOTE — Telephone Encounter (Signed)
Requesting surgical clearance:   1. Type of surgery: Left Total Knee Replacement  2. Surgeon: Dr Kathryne Hitch  3. Surgical date: Pending  4. Medications that need to be help: Eliquis   5. [facility]: (p) [phone] (F) [phone]  Is pt cleared for surgery and how long can eliquis be held

## 2015-07-01 NOTE — Telephone Encounter (Signed)
The patient has no high risk findings for knee surgery.  She can hold her anticoagulation for two days prior to the knee surgery.

## 2015-07-02 NOTE — Telephone Encounter (Signed)
Clearance faxed to [facility] and Dr Kathryne Hitch via Epic

## 2015-07-14 ENCOUNTER — Other Ambulatory Visit: Payer: Self-pay | Admitting: Cardiology

## 2015-07-14 NOTE — Telephone Encounter (Signed)
Rx(s) sent to pharmacy electronically.  

## 2015-07-26 ENCOUNTER — Other Ambulatory Visit: Payer: Self-pay | Admitting: Cardiology

## 2015-07-31 ENCOUNTER — Other Ambulatory Visit: Payer: Self-pay | Admitting: Cardiology

## 2015-07-31 NOTE — Telephone Encounter (Signed)
Rx(s) sent to pharmacy electronically.  

## 2015-08-17 ENCOUNTER — Other Ambulatory Visit: Payer: Self-pay | Admitting: Cardiology

## 2015-09-19 ENCOUNTER — Other Ambulatory Visit: Payer: Self-pay | Admitting: Physician Assistant

## 2015-09-19 NOTE — H&P (Signed)
TOTAL KNEE ADMISSION H&P  Patient is being admitted for left total knee arthroplasty.  Subjective:  Chief Complaint:left knee pain.  HPI: Marilyn Hernandez, 65 y.o. female, has a history of pain and functional disability in the left knee due to arthritis and has failed non-surgical conservative treatments for greater than 12 weeks to includeNSAID's and/or analgesics, corticosteriod injections and activity modification.  Onset of symptoms was gradual, starting >10 years ago with gradually worsening course since that time. The patient noted prior procedures on the knee to include  arthroscopy and menisectomy on the left knee(s).  Patient currently rates pain in the left knee(s) at 3 out of 10 with activity. Patient has night pain, worsening of pain with activity and weight bearing, pain that interferes with activities of daily living and crepitus.  Patient has evidence of subchondral sclerosis and joint space narrowing by imaging studies.  There is no active infection.  Patient Active Problem List   Diagnosis Date Noted  . DVT (deep venous thrombosis) ([place]) 04/07/2015  . S/P total knee replacement 03/19/2015  . S/P laparoscopic appendectomy 10/06/2011  . Acute appendicitis 09/17/2011  . HTN (hypertension) 06/05/2010  . SYNCOPE, HX OF 12/23/2009  . Rheumatoid arthritis(714.0) 12/17/2009  . PLEURAL EFFUSION, LEFT 11/21/2009  . CHEST PAIN-PAINFUL RESPIRATION 11/21/2009  . HYPERCHOLESTEROLEMIA 11/03/2009  . CORONARY ATHEROSCLEROSIS NATIVE CORONARY ARTERY 11/03/2009  . FIBRILLATION, ATRIAL 11/03/2009  . CORONARY ARTERY BYPASS GRAFT, HX OF 11/03/2009   Past Medical History:  Diagnosis Date  . Anemia 1989   "before hysterectomy"  . Complication of anesthesia    "took me a little longer to wakeup in 1979"  . COPD (chronic obstructive pulmonary disease) ([place])    denies 03/19/2015  . Coronary artery disease    s/p NSTEMI 09/2009; s/p CABG 09/2009 [facility] [place]echo 12/12/2009: EF 55-60%,  normal RVF  . 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 GERD (gastroesophageal reflux disease)    occ  . History of gout   . Hyperlipidemia   . Hypertension   . NSTEMI (non-ST elevated myocardial infarction) ([place]) 09/2009  . Pleural effusion 09/2009   post CABG  . Pulmonary nodules   . RA (rheumatoid arthritis) ([place])    "took strong nutrition; working well for me" (03/19/2015)  . URI (upper respiratory infection)    hx    Past Surgical History:  Procedure Laterality Date  . ABDOMINAL HYSTERECTOMY  1989  . APPENDECTOMY    . CARDIAC CATHETERIZATION  09/2009  . CESAREAN SECTION  1977; 1979  . COLONOSCOPY  05/1999   video/notes 06/08/2010  . CORONARY ANGIOPLASTY    . CORONARY ARTERY BYPASS GRAFT  09/2009   LIMA to the LAD, SVG right coronary artery, SVG to obtuse marginal September 2011   . EYE SURGERY     eye procedure laser rupt vessels  . JOINT REPLACEMENT    . KNEE ARTHROSCOPY Bilateral    left; right; right  . LAPAROSCOPIC APPENDECTOMY  09/16/2011   Procedure: APPENDECTOMY LAPAROSCOPIC;  Surgeon: Zenovia Jarred, MD;  Location: [facility];  Service: General;  Laterality: N/A;  . STAPEDECTOMY Right 1996  . TOTAL KNEE ARTHROPLASTY Right 03/19/2015  . TOTAL KNEE ARTHROPLASTY Right 03/19/2015   Procedure: RIGHT TOTAL KNEE ARTHROPLASTY;  Surgeon: Ninetta Lights, MD;  Location: [facility];  Service: Orthopedics;  Laterality: Right;     (Not in a hospital admission) Allergies  Allergen Reactions  . Naproxen Sodium Itching and Rash    Social History  Substance Use Topics  . Smoking status: Never Smoker  . Smokeless tobacco: Never Used  . Alcohol use No    Family History  Problem Relation Age of Onset  . Heart disease Mother     bypass surgery in her 45s  . Arrhythmia Brother     atrial fibrillation     Review of Systems  Constitutional: Negative.   HENT: Negative.   Eyes: Negative.   Respiratory: Negative.   Cardiovascular: Negative.   Gastrointestinal: Negative.   Genitourinary:  Negative.   Musculoskeletal: Positive for joint pain.  Skin: Negative.   Neurological: Negative.   Endo/Heme/Allergies: Negative.   Psychiatric/Behavioral: Negative.     Objective:  Physical Exam  Constitutional: She is oriented to person, place, and time. She appears well-developed and well-nourished.  HENT:  Head: Normocephalic and atraumatic.  Eyes: EOM are normal. Pupils are equal, round, and reactive to light.  Neck: Normal range of motion. Neck supple.  Cardiovascular: Normal rate and regular rhythm.   Respiratory: Effort normal and breath sounds normal.  GI: Soft. Bowel sounds are normal.  Musculoskeletal:  On the left varus alignment.  Motion 3-95.  Tibiofemoral and patellofemoral crepitus.    Neurological: She is alert and oriented to person, place, and time.  Skin: Skin is warm and dry.  Psychiatric: She has a normal mood and affect. Her behavior is normal. Judgment and thought content normal.    Vital signs in last 24 hours: @VSRANGES @  Labs:   Estimated body mass index is 30.86 kg/m as calculated from the following:   Height as of 04/24/15: 5' 9.5" (1.765 m).   Weight as of 04/24/15: 96.2 kg (212 lb).   Imaging Review Plain radiographs demonstrate severe degenerative joint disease of the left knee(s). The overall alignment ismild varus. The bone quality appears to be fair for age and reported activity level.  Assessment/Plan:  End stage arthritis, left knee   The patient history, physical examination, clinical judgment of the provider and imaging studies are consistent with end stage degenerative joint disease of the left knee(s) and total knee arthroplasty is deemed medically necessary. The treatment options including medical management, injection therapy arthroscopy and arthroplasty were discussed at length. The risks and benefits of total knee arthroplasty were presented and reviewed. The risks due to aseptic loosening, infection, stiffness, patella tracking  problems, thromboembolic complications and other imponderables were discussed. The patient acknowledged the explanation, agreed to proceed with the plan and consent was signed. Patient is being admitted for inpatient treatment for surgery, pain control, PT, OT, prophylactic antibiotics, VTE prophylaxis, progressive ambulation and ADL's and discharge planning. The patient is planning to be discharged to skilled nursing facility

## 2015-09-25 ENCOUNTER — Encounter (HOSPITAL_COMMUNITY): Payer: Self-pay

## 2015-09-25 NOTE — Pre-Procedure Instructions (Signed)
Marilyn Hernandez  09/25/2015      [facility] - [place], [place] - [place] [address] Phone: [phone] Fax: [phone]  [id number] [place], [address] [address] Phone: [phone] Fax: [phone]    Your procedure is scheduled on Sept 13  Report to [facility] at 945 A.M.  Call this number if you have problems the morning of surgery:  [phone]   Remember:  Do not eat food or drink liquids after midnight.   Take these medicines the morning of surgery with A SIP OF WATER  Oxycodone-acetaminophen (Roxicet) orTylenol if needed, Metoprolol Tartrate (Lopressor), Nitrostat if needed, ondansetron   Stop taking Asprin per Dr. Percell Miller ( 1 week prior to surgery)  1 week prior to surgery stop  BC's, Goody's, Herbal medications, Fish Oil, Vitamins, Ibuprofen, Advil, Motrin, Aleve   Do not wear jewelry, make-up or nail polish.  Do not wear lotions, powders, or perfumes, or deoderant.  Do not shave 48 hours prior to surgery.  Men may shave face and neck.  Do not bring valuables to the hospital.  [facility] is not responsible for any belongings or valuables.  Contacts, dentures or bridgework may not be worn into surgery.  Leave your suitcase in the car.  After surgery it may be brought to your room.  For patients admitted to the hospital, discharge time will be determined by your treatment team.  Patients discharged the day of surgery will not be allowed to drive home.   Special instructions:  [facility] - Preparing for Surgery  Before surgery, you can play an important role.  Because skin is not sterile, your skin needs to be as free of germs as possible.  You can reduce the number of germs on you skin by washing with CHG (chlorahexidine gluconate) soap before surgery.  CHG is an antiseptic cleaner which kills germs and bonds with  the skin to continue killing germs even after washing.  Please DO NOT use if you have an allergy to CHG or antibacterial soaps.  If your skin becomes reddened/irritated stop using the CHG and inform your nurse when you arrive at Short Stay.  Do not shave (including legs and underarms) for at least 48 hours prior to the first CHG shower.  You may shave your face.  Please follow these instructions carefully:   1.  Shower with CHG Soap the night before surgery and the  morning of Surgery.  2.  If you choose to wash your hair, wash your hair first as usual with your   normal shampoo.  3.  After you shampoo, rinse your hair and body thoroughly to remove the                      Shampoo.  4.  Use CHG as you would any other liquid soap.  You can apply chg directly to the skin and wash gently with scrungie or a clean washcloth.  5.  Apply the CHG Soap to your body ONLY FROM THE NECK DOWN.   Do not use on open wounds or open sores.  Avoid contact with your eyes,  ears, mouth and genitals (private parts).  Wash genitals (private parts)  with your normal soap.  6.  Wash thoroughly, paying special attention to the area where your surgery will be performed.  7.  Thoroughly rinse your body with warm water from the neck down.  8.  DO NOT shower/wash with your normal soap after using and rinsing off   the CHG Soap.  9.  Pat yourself dry with a clean towel.            10.  Wear clean pajamas.            11.  Place clean sheets on your bed the night of your first shower and do not sleep with pets.  Day of Surgery  Do not apply any lotions/deoderants the morning of surgery.  Please wear clean clothes to the hospital/surgery center.     Please read over the following fact sheets that you were given. Total Joint Packet, MRSA Information and Surgical Site Infection Prevention

## 2015-09-26 ENCOUNTER — Encounter (HOSPITAL_COMMUNITY)
Admission: RE | Admit: 2015-09-26 | Discharge: 2015-09-26 | Disposition: A | Discharge status: Home / Self Care | Payer: Medicare Other | Source: Ambulatory Visit | Attending: Orthopedic Surgery | Admitting: Orthopedic Surgery

## 2015-09-26 ENCOUNTER — Encounter (HOSPITAL_COMMUNITY): Payer: Self-pay

## 2015-09-26 DIAGNOSIS — Z01812 Encounter for preprocedural laboratory examination: Secondary | ICD-10-CM | POA: Diagnosis not present

## 2015-09-26 DIAGNOSIS — I252 Old myocardial infarction: Secondary | ICD-10-CM | POA: Diagnosis not present

## 2015-09-26 DIAGNOSIS — M1712 Unilateral primary osteoarthritis, left knee: Secondary | ICD-10-CM | POA: Diagnosis not present

## 2015-09-26 DIAGNOSIS — Z951 Presence of aortocoronary bypass graft: Secondary | ICD-10-CM | POA: Insufficient documentation

## 2015-09-26 DIAGNOSIS — Z79899 Other long term (current) drug therapy: Secondary | ICD-10-CM | POA: Diagnosis not present

## 2015-09-26 DIAGNOSIS — Z86718 Personal history of other venous thrombosis and embolism: Secondary | ICD-10-CM | POA: Insufficient documentation

## 2015-09-26 DIAGNOSIS — Z7982 Long term (current) use of aspirin: Secondary | ICD-10-CM | POA: Diagnosis not present

## 2015-09-26 DIAGNOSIS — J449 Chronic obstructive pulmonary disease, unspecified: Secondary | ICD-10-CM | POA: Diagnosis not present

## 2015-09-26 DIAGNOSIS — I10 Essential (primary) hypertension: Secondary | ICD-10-CM | POA: Diagnosis not present

## 2015-09-26 DIAGNOSIS — Z96651 Presence of right artificial knee joint: Secondary | ICD-10-CM | POA: Insufficient documentation

## 2015-09-26 DIAGNOSIS — K219 Gastro-esophageal reflux disease without esophagitis: Secondary | ICD-10-CM | POA: Insufficient documentation

## 2015-09-26 DIAGNOSIS — E785 Hyperlipidemia, unspecified: Secondary | ICD-10-CM | POA: Insufficient documentation

## 2015-09-26 DIAGNOSIS — M069 Rheumatoid arthritis, unspecified: Secondary | ICD-10-CM | POA: Insufficient documentation

## 2015-09-26 DIAGNOSIS — I2510 Atherosclerotic heart disease of native coronary artery without angina pectoris: Secondary | ICD-10-CM | POA: Insufficient documentation

## 2015-09-26 DIAGNOSIS — Z01818 Encounter for other preprocedural examination: Secondary | ICD-10-CM | POA: Insufficient documentation

## 2015-09-26 DIAGNOSIS — Z0183 Encounter for blood typing: Secondary | ICD-10-CM | POA: Insufficient documentation

## 2015-09-26 DIAGNOSIS — R9431 Abnormal electrocardiogram [ECG] [EKG]: Secondary | ICD-10-CM | POA: Diagnosis not present

## 2015-09-26 DIAGNOSIS — R918 Other nonspecific abnormal finding of lung field: Secondary | ICD-10-CM | POA: Diagnosis not present

## 2015-09-26 HISTORY — DX: Acute embolism and thrombosis of unspecified deep veins of unspecified lower extremity: I82.409

## 2015-09-26 LAB — TYPE AND SCREEN
ABO/RH(D): O POS
Antibody Screen: NEGATIVE

## 2015-09-26 LAB — CBC
HCT: 43.1 % (ref 36.0–46.0)
Hemoglobin: 13.4 g/dL (ref 12.0–15.0)
MCH: 28.2 pg (ref 26.0–34.0)
MCHC: 31.1 g/dL (ref 30.0–36.0)
MCV: 90.7 fL (ref 78.0–100.0)
PLATELETS: 332 10*3/uL (ref 150–400)
RBC: 4.75 MIL/uL (ref 3.87–5.11)
RDW: 14.2 % (ref 11.5–15.5)
WBC: 10.2 10*3/uL (ref 4.0–10.5)

## 2015-09-26 LAB — SURGICAL PCR SCREEN
MRSA, PCR: NEGATIVE
Staphylococcus aureus: NEGATIVE

## 2015-09-26 LAB — BASIC METABOLIC PANEL
Anion gap: 10 (ref 5–15)
BUN: 15 mg/dL (ref 6–20)
CALCIUM: 9.4 mg/dL (ref 8.9–10.3)
CO2: 22 mmol/L (ref 22–32)
CREATININE: 1 mg/dL (ref 0.44–1.00)
Chloride: 107 mmol/L (ref 101–111)
GFR calc Af Amer: >60 mL/min (ref >60)
GFR calc non Af Amer: 57 mL/min — ABNORMAL LOW (ref >60)
GLUCOSE: 92 mg/dL (ref 65–99)
Potassium: 4.2 mmol/L (ref 3.5–5.1)
Sodium: 139 mmol/L (ref 135–145)

## 2015-09-26 NOTE — Progress Notes (Addendum)
BJ:[id number] conroy,PA @ [facility] in [place], [place]  Cardiologist: Dr. Lavonna Monarch  Pt. States Dr. Debroah Loop instructions to stop aspirin 1 Week prior to surgery.  Left message at Dr. Debroah Loop office for clearance letter.

## 2015-09-29 ENCOUNTER — Other Ambulatory Visit: Payer: Self-pay | Admitting: Cardiology

## 2015-09-30 ENCOUNTER — Other Ambulatory Visit: Payer: Self-pay | Admitting: Cardiology

## 2015-09-30 NOTE — Telephone Encounter (Signed)
Rx(s) sent to pharmacy electronically.  

## 2015-09-30 NOTE — Progress Notes (Signed)
Anesthesia Chart Review: Patient is a 65 year old female scheduled for left TKR on 10/08/15 by Dr. Kathryne Hitch. Case is posted for General, but her 03/19/15 right TKR was done under spinal.   PMH includes:  CAD (s/p NSTEMI 09/2009 while vacationing in [place], [place]; s/p CABG 09/2009: LIMA to LAD, SVG to RCA and SVG to OM at [facility]), post-CABG afib (resolved), HTN, hyperlipidemia, COPD, never smoker, pulmonary nodules, GERD, right eyelid cancer, RA, gout, DVT, appendectomy '13, hysterectomy, right TKA 0000000 complicated by RLE DVT (0000000; s/p 3 months anticoagulation).  - PCP is Dr. Daiva Eves with [facility]. - Cardiologist is Dr. Minus Breeding, last visit 04/24/15. On 07/01/15 telephone encounter, he wrote, "The patient has no high risk findings for knee surgery.  She can hold her anticoagulation for two days prior to the knee surgery." - She saw pulmonologist Dr. Kara Mead for post-operative left pleural effusion, pulmonary nodules on CT, and COPD (by PFTs). She did not require thoracentesis. Follow-up chest CT findings 12/12/09 showed stable inferior LUL nodules, benight pleural based RLL nodules, and bilateral ground-glass opacities in upper lobs likely inflammatory or infectious. According to his 03/11/10 office note, he did not recommended follow-up for sub-centimeter pulmonary nodules in a non-smoker.    Medications include: ASA 81 mg, folic acid, KCl, Lopressor, Nitro, rosuvastatin. Confirmed with patient that she is no longer on Eliquis (discontined after 3 months therapy for DVT).   BP (!) 149/75   Pulse 72   Temp 37 C (Oral)   Resp 18   Ht 5' 8.5" (1.74 m)   Wt 230 lb 3 oz (104.4 kg)   SpO2 96%   BMI 34.49 kg/m   09/26/15 EKG: Sinus rhythm with occasional PVCs, RSR prime or QR pattern in V1 suggests RV conduction delay. PVCs are new when compared to prior tracing 07/11/14.   12/12/09 Echo 12/12/09:  - Left ventricle: No definite wall  motion abnormalities though endocardium is difficult to see in all views. The cavity size was normal. Wall thickness was normal. Systolic function was normal. The estimated ejection fraction was in the range of 55% to 60%.  09/27/09 Pre-CABG cath report is scanned under the Notes tab, Encounter 09/27/09, date of service 12/10/09.  06/04/15 RLE venous duplex: Impression: No evidence of RLE DVT or supericial venous thrombus or incompetence.  According to 11/21/09 office note by Dr. Elsworth Soho: "PFTs >> Severe airway obstructionper PFTs. Moderately severe restrictive - parenchymal on PFTs (likely secondary to obesity and bilateral pleural effusions as well as impaired left diaphragm excursion)."  Preoperative labs reviewed. (No urine culture ordered by surgeon.)  If no acute changes then I anticipate she can proceed with surgery as scheduled. Definitive anesthesia plan following anesthesiologist evaluation on the day of surgery.  George Hugh [facility] Short Stay Center/Anesthesiology Phone [phone] 09/30/2015 11:42 AM

## 2015-10-07 MED ORDER — TRANEXAMIC ACID 1000 MG/10ML IV SOLN
2000.0000 mg | INTRAVENOUS | Status: AC
  Administered 2015-10-08: 2000 mg via TOPICAL
  Filled 2015-10-07: qty 20

## 2015-10-07 NOTE — Progress Notes (Signed)
Left message for patient to arrive at 0850 on 10/08/15.

## 2015-10-08 ENCOUNTER — Encounter (HOSPITAL_COMMUNITY): Payer: Self-pay | Admitting: *Deleted

## 2015-10-08 ENCOUNTER — Inpatient Hospital Stay (HOSPITAL_COMMUNITY): Payer: Medicare Other | Admitting: Vascular Surgery

## 2015-10-08 ENCOUNTER — Inpatient Hospital Stay (HOSPITAL_COMMUNITY): Payer: Medicare Other

## 2015-10-08 ENCOUNTER — Inpatient Hospital Stay (HOSPITAL_COMMUNITY)
Admission: RE | Admit: 2015-10-08 | Discharge: 2015-10-10 | DRG: 470 | Disposition: A | Discharge status: Skilled Nursing Facility | Payer: Medicare Other | Source: Ambulatory Visit | Attending: Orthopedic Surgery | Admitting: Orthopedic Surgery

## 2015-10-08 ENCOUNTER — Encounter (HOSPITAL_COMMUNITY)
Admission: RE | Disposition: A | Discharge status: Skilled Nursing Facility | Payer: Self-pay | Source: Ambulatory Visit | Attending: Orthopedic Surgery

## 2015-10-08 DIAGNOSIS — Z86718 Personal history of other venous thrombosis and embolism: Secondary | ICD-10-CM | POA: Diagnosis not present

## 2015-10-08 DIAGNOSIS — M2342 Loose body in knee, left knee: Secondary | ICD-10-CM | POA: Diagnosis present

## 2015-10-08 DIAGNOSIS — I252 Old myocardial infarction: Secondary | ICD-10-CM | POA: Diagnosis not present

## 2015-10-08 DIAGNOSIS — E7800 Pure hypercholesterolemia, unspecified: Secondary | ICD-10-CM | POA: Diagnosis present

## 2015-10-08 DIAGNOSIS — D62 Acute posthemorrhagic anemia: Secondary | ICD-10-CM | POA: Diagnosis not present

## 2015-10-08 DIAGNOSIS — M109 Gout, unspecified: Secondary | ICD-10-CM | POA: Diagnosis present

## 2015-10-08 DIAGNOSIS — I10 Essential (primary) hypertension: Secondary | ICD-10-CM | POA: Diagnosis present

## 2015-10-08 DIAGNOSIS — M25562 Pain in left knee: Secondary | ICD-10-CM

## 2015-10-08 DIAGNOSIS — I2510 Atherosclerotic heart disease of native coronary artery without angina pectoris: Secondary | ICD-10-CM | POA: Diagnosis present

## 2015-10-08 DIAGNOSIS — M069 Rheumatoid arthritis, unspecified: Secondary | ICD-10-CM | POA: Diagnosis present

## 2015-10-08 DIAGNOSIS — J449 Chronic obstructive pulmonary disease, unspecified: Secondary | ICD-10-CM | POA: Diagnosis present

## 2015-10-08 DIAGNOSIS — Z951 Presence of aortocoronary bypass graft: Secondary | ICD-10-CM | POA: Diagnosis not present

## 2015-10-08 DIAGNOSIS — Z96659 Presence of unspecified artificial knee joint: Secondary | ICD-10-CM

## 2015-10-08 DIAGNOSIS — M1712 Unilateral primary osteoarthritis, left knee: Principal | ICD-10-CM | POA: Diagnosis present

## 2015-10-08 HISTORY — PX: TOTAL KNEE ARTHROPLASTY: SHX125

## 2015-10-08 SURGERY — ARTHROPLASTY, KNEE, TOTAL
Anesthesia: Spinal | Site: Knee | Laterality: Left

## 2015-10-08 MED ORDER — POTASSIUM CHLORIDE CRYS ER 10 MEQ PO TBCR: 10.0000 meq | EXTENDED_RELEASE_TABLET | Freq: Every day | ORAL | Status: DC | PRN

## 2015-10-08 MED ORDER — ONDANSETRON HCL 4 MG PO TABS: 4.0000 mg | ORAL_TABLET | Freq: Four times a day (QID) | ORAL | Status: DC | PRN

## 2015-10-08 MED ORDER — PROPOFOL 10 MG/ML IV BOLUS
INTRAVENOUS | Status: DC | PRN
  Administered 2015-10-08: 40 mg via INTRAVENOUS
  Administered 2015-10-08: 60 mg via INTRAVENOUS

## 2015-10-08 MED ORDER — BUPIVACAINE LIPOSOME 1.3 % IJ SUSP
20.0000 mL | INTRAMUSCULAR | Status: DC
  Filled 2015-10-08: qty 20

## 2015-10-08 MED ORDER — CEFAZOLIN SODIUM-DEXTROSE 2-4 GM/100ML-% IV SOLN
2.0000 g | Freq: Four times a day (QID) | INTRAVENOUS | Status: AC
  Administered 2015-10-08 – 2015-10-09 (×2): 2 g via INTRAVENOUS
  Filled 2015-10-08 (×2): qty 100

## 2015-10-08 MED ORDER — CEFAZOLIN SODIUM-DEXTROSE 2-4 GM/100ML-% IV SOLN
2.0000 g | INTRAVENOUS | Status: AC
  Administered 2015-10-08: 2 g via INTRAVENOUS

## 2015-10-08 MED ORDER — LACTATED RINGERS IV SOLN: INTRAVENOUS | Status: DC

## 2015-10-08 MED ORDER — PHENOL 1.4 % MT LIQD: 1.0000 | OROMUCOSAL | Status: DC | PRN

## 2015-10-08 MED ORDER — OXYCODONE HCL 5 MG PO TABS
ORAL_TABLET | ORAL | Status: AC
  Filled 2015-10-08: qty 2

## 2015-10-08 MED ORDER — ACETAMINOPHEN 325 MG PO TABS
650.0000 mg | ORAL_TABLET | Freq: Four times a day (QID) | ORAL | Status: DC | PRN
  Administered 2015-10-08: 650 mg via ORAL

## 2015-10-08 MED ORDER — METOPROLOL TARTRATE 25 MG PO TABS
25.0000 mg | ORAL_TABLET | Freq: Two times a day (BID) | ORAL | Status: DC
  Administered 2015-10-08 – 2015-10-09 (×2): 25 mg via ORAL
  Filled 2015-10-08 (×3): qty 1

## 2015-10-08 MED ORDER — METHOCARBAMOL 500 MG PO TABS
ORAL_TABLET | ORAL | Status: AC
  Administered 2015-10-08: 500 mg via ORAL
  Filled 2015-10-08: qty 1

## 2015-10-08 MED ORDER — METOCLOPRAMIDE HCL 5 MG PO TABS: 5.0000 mg | ORAL_TABLET | Freq: Three times a day (TID) | ORAL | Status: DC | PRN

## 2015-10-08 MED ORDER — METHOCARBAMOL 500 MG PO TABS: 500.0000 mg | ORAL_TABLET | Freq: Four times a day (QID) | ORAL | 0 refills | Status: DC | PRN

## 2015-10-08 MED ORDER — LACTATED RINGERS IV SOLN
INTRAVENOUS | Status: DC
  Administered 2015-10-08 (×3): via INTRAVENOUS

## 2015-10-08 MED ORDER — APIXABAN 2.5 MG PO TABS
2.5000 mg | ORAL_TABLET | Freq: Two times a day (BID) | ORAL | Status: DC
  Administered 2015-10-09 – 2015-10-10 (×3): 2.5 mg via ORAL
  Filled 2015-10-08 (×3): qty 1

## 2015-10-08 MED ORDER — BUPIVACAINE HCL (PF) 0.5 % IJ SOLN
INTRAMUSCULAR | Status: AC
  Filled 2015-10-08: qty 10

## 2015-10-08 MED ORDER — BUPIVACAINE LIPOSOME 1.3 % IJ SUSP
INTRAMUSCULAR | Status: DC | PRN
  Administered 2015-10-08: 20 mL

## 2015-10-08 MED ORDER — FENTANYL CITRATE (PF) 100 MCG/2ML IJ SOLN
INTRAMUSCULAR | Status: DC | PRN
  Administered 2015-10-08: 50 ug via INTRAVENOUS
  Administered 2015-10-08: 100 ug via INTRAVENOUS
  Administered 2015-10-08: 50 ug via INTRAVENOUS

## 2015-10-08 MED ORDER — SORBITOL 70 % SOLN
30.0000 mL | Freq: Every day | Status: DC | PRN
  Administered 2015-10-10: 30 mL via ORAL
  Filled 2015-10-08: qty 30

## 2015-10-08 MED ORDER — OXYCODONE-ACETAMINOPHEN 5-325 MG PO TABS: 1.0000 | ORAL_TABLET | ORAL | 0 refills | Status: DC | PRN

## 2015-10-08 MED ORDER — PROMETHAZINE HCL 25 MG/ML IJ SOLN: 6.2500 mg | INTRAMUSCULAR | Status: DC | PRN

## 2015-10-08 MED ORDER — ONDANSETRON HCL 4 MG/2ML IJ SOLN
INTRAMUSCULAR | Status: DC | PRN
  Administered 2015-10-08: 4 mg via INTRAVENOUS

## 2015-10-08 MED ORDER — METHOCARBAMOL 500 MG PO TABS
500.0000 mg | ORAL_TABLET | Freq: Four times a day (QID) | ORAL | Status: DC | PRN
  Administered 2015-10-08 – 2015-10-10 (×6): 500 mg via ORAL
  Filled 2015-10-08 (×5): qty 1

## 2015-10-08 MED ORDER — METOCLOPRAMIDE HCL 5 MG/ML IJ SOLN
INTRAMUSCULAR | Status: AC
  Filled 2015-10-08: qty 2

## 2015-10-08 MED ORDER — PROPOFOL 10 MG/ML IV BOLUS
INTRAVENOUS | Status: AC
  Filled 2015-10-08: qty 20

## 2015-10-08 MED ORDER — FENTANYL CITRATE (PF) 100 MCG/2ML IJ SOLN
INTRAMUSCULAR | Status: AC
  Filled 2015-10-08: qty 2

## 2015-10-08 MED ORDER — OMEPRAZOLE 20 MG PO CPDR: 20.0000 mg | DELAYED_RELEASE_CAPSULE | Freq: Every day | ORAL | 2 refills | Status: DC

## 2015-10-08 MED ORDER — MIDAZOLAM HCL 5 MG/5ML IJ SOLN
INTRAMUSCULAR | Status: DC | PRN
  Administered 2015-10-08: 2 mg via INTRAVENOUS

## 2015-10-08 MED ORDER — ONDANSETRON HCL 4 MG/2ML IJ SOLN
INTRAMUSCULAR | Status: AC
  Filled 2015-10-08: qty 2

## 2015-10-08 MED ORDER — PHENYLEPHRINE HCL 10 MG/ML IJ SOLN
INTRAVENOUS | Status: DC | PRN
  Administered 2015-10-08: 10 ug/min via INTRAVENOUS

## 2015-10-08 MED ORDER — OXYCODONE HCL 5 MG PO TABS
5.0000 mg | ORAL_TABLET | ORAL | Status: DC | PRN
  Administered 2015-10-08 – 2015-10-10 (×12): 10 mg via ORAL
  Filled 2015-10-08 (×10): qty 2

## 2015-10-08 MED ORDER — DEXAMETHASONE SODIUM PHOSPHATE 10 MG/ML IJ SOLN
INTRAMUSCULAR | Status: AC
  Filled 2015-10-08: qty 1

## 2015-10-08 MED ORDER — SCOPOLAMINE 1 MG/3DAYS TD PT72
MEDICATED_PATCH | TRANSDERMAL | Status: AC
  Filled 2015-10-08: qty 1

## 2015-10-08 MED ORDER — DEXTROSE-NACL 5-0.45 % IV SOLN
INTRAVENOUS | Status: DC
  Administered 2015-10-08: 22:00:00 via INTRAVENOUS

## 2015-10-08 MED ORDER — APIXABAN 2.5 MG PO TABS: 2.5000 mg | ORAL_TABLET | Freq: Two times a day (BID) | ORAL | 0 refills | Status: DC

## 2015-10-08 MED ORDER — ASPIRIN EC 325 MG PO TBEC: 325.0000 mg | DELAYED_RELEASE_TABLET | Freq: Every day | ORAL | 0 refills | Status: DC

## 2015-10-08 MED ORDER — MEPERIDINE HCL 25 MG/ML IJ SOLN: 6.2500 mg | INTRAMUSCULAR | Status: DC | PRN

## 2015-10-08 MED ORDER — HYDROMORPHONE HCL 1 MG/ML IJ SOLN
0.2500 mg | INTRAMUSCULAR | Status: DC | PRN
  Administered 2015-10-08 (×3): 0.5 mg via INTRAVENOUS

## 2015-10-08 MED ORDER — SCOPOLAMINE 1 MG/3DAYS TD PT72
MEDICATED_PATCH | TRANSDERMAL | Status: DC | PRN
  Administered 2015-10-08: 1 via TRANSDERMAL

## 2015-10-08 MED ORDER — CHLORHEXIDINE GLUCONATE 4 % EX LIQD: 60.0000 mL | Freq: Once | CUTANEOUS | Status: DC

## 2015-10-08 MED ORDER — ONDANSETRON HCL 4 MG/2ML IJ SOLN
4.0000 mg | Freq: Four times a day (QID) | INTRAMUSCULAR | Status: DC | PRN
  Administered 2015-10-08: 4 mg via INTRAVENOUS

## 2015-10-08 MED ORDER — BUPIVACAINE HCL 0.5 % IJ SOLN
INTRAMUSCULAR | Status: DC | PRN
  Administered 2015-10-08: 10 mL

## 2015-10-08 MED ORDER — ROSUVASTATIN CALCIUM 40 MG PO TABS
40.0000 mg | ORAL_TABLET | Freq: Every day | ORAL | Status: DC
  Administered 2015-10-08 – 2015-10-09 (×2): 40 mg via ORAL
  Filled 2015-10-08 (×2): qty 1
  Filled 2015-10-08: qty 4

## 2015-10-08 MED ORDER — DOCUSATE SODIUM 100 MG PO CAPS
100.0000 mg | ORAL_CAPSULE | Freq: Two times a day (BID) | ORAL | Status: DC
  Administered 2015-10-08 – 2015-10-10 (×4): 100 mg via ORAL
  Filled 2015-10-08 (×4): qty 1

## 2015-10-08 MED ORDER — ACETAMINOPHEN 325 MG PO TABS
ORAL_TABLET | ORAL | Status: AC
  Administered 2015-10-08: 650 mg via ORAL
  Filled 2015-10-08: qty 2

## 2015-10-08 MED ORDER — PHENYLEPHRINE HCL 10 MG/ML IJ SOLN
INTRAMUSCULAR | Status: DC | PRN
  Administered 2015-10-08 (×2): 80 ug via INTRAVENOUS
  Administered 2015-10-08: 40 ug via INTRAVENOUS
  Administered 2015-10-08 (×2): 80 ug via INTRAVENOUS
  Administered 2015-10-08: 40 ug via INTRAVENOUS

## 2015-10-08 MED ORDER — DEXAMETHASONE SODIUM PHOSPHATE 10 MG/ML IJ SOLN
INTRAMUSCULAR | Status: DC | PRN
  Administered 2015-10-08: 10 mg via INTRAVENOUS

## 2015-10-08 MED ORDER — DEXTROSE 5 % IV SOLN
500.0000 mg | Freq: Four times a day (QID) | INTRAVENOUS | Status: DC | PRN
Start: 2015-10-08 — End: 2015-10-10
  Filled 2015-10-08: qty 5

## 2015-10-08 MED ORDER — EPHEDRINE 5 MG/ML INJ
INTRAVENOUS | Status: AC
  Filled 2015-10-08: qty 10

## 2015-10-08 MED ORDER — POLYETHYLENE GLYCOL 3350 17 G PO PACK
17.0000 g | PACK | Freq: Every day | ORAL | Status: DC | PRN
  Administered 2015-10-09: 17 g via ORAL
  Filled 2015-10-08: qty 1

## 2015-10-08 MED ORDER — EPHEDRINE SULFATE 50 MG/ML IJ SOLN
INTRAMUSCULAR | Status: DC | PRN
  Administered 2015-10-08 (×3): 5 mg via INTRAVENOUS

## 2015-10-08 MED ORDER — METOCLOPRAMIDE HCL 5 MG/ML IJ SOLN
5.0000 mg | Freq: Three times a day (TID) | INTRAMUSCULAR | Status: DC | PRN
  Administered 2015-10-08: 10 mg via INTRAVENOUS

## 2015-10-08 MED ORDER — ONDANSETRON HCL 4 MG PO TABS: 4.0000 mg | ORAL_TABLET | Freq: Three times a day (TID) | ORAL | 0 refills | Status: DC | PRN

## 2015-10-08 MED ORDER — ASPIRIN 325 MG PO TABS
325.0000 mg | ORAL_TABLET | Freq: Every day | ORAL | Status: DC
  Administered 2015-10-09 – 2015-10-10 (×2): 325 mg via ORAL
  Filled 2015-10-08 (×2): qty 1

## 2015-10-08 MED ORDER — SODIUM CHLORIDE 0.9 % IJ SOLN
INTRAMUSCULAR | Status: DC | PRN
  Administered 2015-10-08: 40 mL

## 2015-10-08 MED ORDER — DOCUSATE SODIUM 100 MG PO CAPS: 100.0000 mg | ORAL_CAPSULE | Freq: Two times a day (BID) | ORAL | 0 refills | Status: DC

## 2015-10-08 MED ORDER — MENTHOL 3 MG MT LOZG
1.0000 | LOZENGE | OROMUCOSAL | Status: DC | PRN
Start: 2015-10-08 — End: 2015-10-10

## 2015-10-08 MED ORDER — DIPHENHYDRAMINE HCL 12.5 MG/5ML PO ELIX: 12.5000 mg | ORAL_SOLUTION | ORAL | Status: DC | PRN

## 2015-10-08 MED ORDER — ONDANSETRON HCL 4 MG/2ML IJ SOLN
INTRAMUSCULAR | Status: AC
  Administered 2015-10-08: 4 mg via INTRAVENOUS
  Filled 2015-10-08: qty 2

## 2015-10-08 MED ORDER — ACETAMINOPHEN 650 MG RE SUPP: 650.0000 mg | Freq: Four times a day (QID) | RECTAL | Status: DC | PRN

## 2015-10-08 MED ORDER — BUPIVACAINE IN DEXTROSE 0.75-8.25 % IT SOLN
INTRATHECAL | Status: DC | PRN
  Administered 2015-10-08: 2 mL via INTRATHECAL

## 2015-10-08 MED ORDER — SODIUM CHLORIDE 0.9 % IR SOLN
Status: DC | PRN
  Administered 2015-10-08: 3000 mL

## 2015-10-08 MED ORDER — MORPHINE SULFATE (PF) 2 MG/ML IV SOLN
2.0000 mg | INTRAVENOUS | Status: DC | PRN
  Administered 2015-10-08: 2 mg via INTRAVENOUS
  Filled 2015-10-08: qty 1

## 2015-10-08 MED ORDER — NITROGLYCERIN 0.4 MG SL SUBL: 0.4000 mg | SUBLINGUAL_TABLET | SUBLINGUAL | Status: DC | PRN

## 2015-10-08 MED ORDER — OXYCODONE HCL 5 MG PO TABS
ORAL_TABLET | ORAL | Status: AC
  Administered 2015-10-08: 10 mg via ORAL
  Filled 2015-10-08: qty 2

## 2015-10-08 MED ORDER — MIDAZOLAM HCL 2 MG/2ML IJ SOLN
INTRAMUSCULAR | Status: AC
  Filled 2015-10-08: qty 2

## 2015-10-08 MED ORDER — HYDROMORPHONE HCL 1 MG/ML IJ SOLN
INTRAMUSCULAR | Status: AC
  Filled 2015-10-08: qty 1

## 2015-10-08 MED ORDER — DEXAMETHASONE SODIUM PHOSPHATE 10 MG/ML IJ SOLN
10.0000 mg | Freq: Once | INTRAMUSCULAR | Status: AC
  Administered 2015-10-09: 10 mg via INTRAVENOUS
  Filled 2015-10-08: qty 1

## 2015-10-08 MED ORDER — FUROSEMIDE 20 MG PO TABS: 20.0000 mg | ORAL_TABLET | Freq: Every day | ORAL | Status: DC | PRN

## 2015-10-08 MED ORDER — PROPOFOL 500 MG/50ML IV EMUL
INTRAVENOUS | Status: DC | PRN
  Administered 2015-10-08: 75 ug/kg/min via INTRAVENOUS

## 2015-10-08 MED ORDER — CEFAZOLIN SODIUM-DEXTROSE 2-4 GM/100ML-% IV SOLN
INTRAVENOUS | Status: AC
  Filled 2015-10-08: qty 100

## 2015-10-08 MED ORDER — ACETAMINOPHEN 325 MG PO TABS
650.0000 mg | ORAL_TABLET | Freq: Four times a day (QID) | ORAL | Status: AC
  Administered 2015-10-08 – 2015-10-09 (×3): 650 mg via ORAL
  Filled 2015-10-08 (×3): qty 2

## 2015-10-08 MED ORDER — SENNA 8.6 MG PO TABS
1.0000 | ORAL_TABLET | Freq: Two times a day (BID) | ORAL | Status: DC
  Administered 2015-10-08 – 2015-10-10 (×4): 8.6 mg via ORAL
  Filled 2015-10-08 (×4): qty 1

## 2015-10-08 MED ORDER — LIDOCAINE HCL (CARDIAC) 20 MG/ML IV SOLN
INTRAVENOUS | Status: DC | PRN
  Administered 2015-10-08: 20 mg via INTRATRACHEAL

## 2015-10-08 MED ORDER — HYDROMORPHONE HCL 1 MG/ML IJ SOLN
INTRAMUSCULAR | Status: AC
  Administered 2015-10-08: 0.5 mg via INTRAVENOUS
  Filled 2015-10-08: qty 1

## 2015-10-08 SURGICAL SUPPLY — 65 items
APL SKNCLS STERI-STRIP NONHPOA (GAUZE/BANDAGES/DRESSINGS) ×1
BANDAGE ACE 4X5 VEL STRL LF (GAUZE/BANDAGES/DRESSINGS) ×3 IMPLANT
BANDAGE ACE 6X5 VEL STRL LF (GAUZE/BANDAGES/DRESSINGS) ×3 IMPLANT
BANDAGE ESMARK 6X9 LF (GAUZE/BANDAGES/DRESSINGS) ×1 IMPLANT
BENZOIN TINCTURE PRP APPL 2/3 (GAUZE/BANDAGES/DRESSINGS) ×3 IMPLANT
BLADE SAG 18X100X1.27 (BLADE) ×6 IMPLANT
BNDG CMPR 9X6 STRL LF SNTH (GAUZE/BANDAGES/DRESSINGS) ×1
BNDG ESMARK 6X9 LF (GAUZE/BANDAGES/DRESSINGS) ×3
BOWL SMART MIX CTS (DISPOSABLE) ×3 IMPLANT
CAPT KNEE TOTAL 3 ×2 IMPLANT
CEMENT BONE SIMPLEX SPEEDSET (Cement) ×6 IMPLANT
CLOSURE WOUND 1/2 X4 (GAUZE/BANDAGES/DRESSINGS) ×1
COVER SURGICAL LIGHT HANDLE (MISCELLANEOUS) ×3 IMPLANT
CUFF TOURNIQUET SINGLE 34IN LL (TOURNIQUET CUFF) ×3 IMPLANT
DRAPE EXTREMITY T 121X128X90 (DRAPE) ×3 IMPLANT
DRAPE IMP U-DRAPE 54X76 (DRAPES) ×3 IMPLANT
DRAPE PROXIMA HALF (DRAPES) ×3 IMPLANT
DRAPE U-SHAPE 47X51 STRL (DRAPES) ×3 IMPLANT
DRSG AQUACEL AG ADV 3.5X10 (GAUZE/BANDAGES/DRESSINGS) ×3 IMPLANT
DURAPREP 26ML APPLICATOR (WOUND CARE) ×6 IMPLANT
ELECT CAUTERY BLADE 6.4 (BLADE) ×3 IMPLANT
ELECT REM PT RETURN 9FT ADLT (ELECTROSURGICAL) ×3
ELECTRODE REM PT RTRN 9FT ADLT (ELECTROSURGICAL) ×1 IMPLANT
EVACUATOR 1/8 PVC DRAIN (DRAIN) IMPLANT
FACESHIELD WRAPAROUND (MASK) ×6 IMPLANT
FACESHIELD WRAPAROUND OR TEAM (MASK) ×2 IMPLANT
GLOVE BIOGEL PI IND STRL 7.0 (GLOVE) ×1 IMPLANT
GLOVE BIOGEL PI INDICATOR 7.0 (GLOVE) ×2
GLOVE ORTHO TXT STRL SZ7.5 (GLOVE) ×3 IMPLANT
GLOVE SURG ORTHO 7.0 STRL STRW (GLOVE) ×3 IMPLANT
GOWN STRL REUS W/ TWL LRG LVL3 (GOWN DISPOSABLE) ×2 IMPLANT
GOWN STRL REUS W/ TWL XL LVL3 (GOWN DISPOSABLE) ×1 IMPLANT
GOWN STRL REUS W/TWL LRG LVL3 (GOWN DISPOSABLE) ×6
GOWN STRL REUS W/TWL XL LVL3 (GOWN DISPOSABLE) ×3
HANDPIECE INTERPULSE COAX TIP (DISPOSABLE) ×3
IMMOBILIZER KNEE 22 UNIV (SOFTGOODS) ×3 IMPLANT
IMMOBILIZER KNEE 24 THIGH 36 (MISCELLANEOUS) IMPLANT
IMMOBILIZER KNEE 24 UNIV (MISCELLANEOUS)
KIT BASIN OR (CUSTOM PROCEDURE TRAY) ×3 IMPLANT
KIT ROOM TURNOVER OR (KITS) ×3 IMPLANT
MANIFOLD NEPTUNE II (INSTRUMENTS) ×3 IMPLANT
NDL 18GX1X1/2 (RX/OR ONLY) (NEEDLE) ×1 IMPLANT
NDL HYPO 25GX1X1/2 BEV (NEEDLE) ×1 IMPLANT
NEEDLE 18GX1X1/2 (RX/OR ONLY) (NEEDLE) ×3 IMPLANT
NEEDLE HYPO 25GX1X1/2 BEV (NEEDLE) ×3 IMPLANT
NS IRRIG 1000ML POUR BTL (IV SOLUTION) ×3 IMPLANT
PACK TOTAL JOINT (CUSTOM PROCEDURE TRAY) ×3 IMPLANT
PACK UNIVERSAL I (CUSTOM PROCEDURE TRAY) ×3 IMPLANT
PAD ARMBOARD 7.5X6 YLW CONV (MISCELLANEOUS) ×6 IMPLANT
SET HNDPC FAN SPRY TIP SCT (DISPOSABLE) ×1 IMPLANT
STRIP CLOSURE SKIN 1/2X4 (GAUZE/BANDAGES/DRESSINGS) ×3 IMPLANT
SUCTION FRAZIER HANDLE 10FR (MISCELLANEOUS) ×2
SUCTION TUBE FRAZIER 10FR DISP (MISCELLANEOUS) ×1 IMPLANT
SUT MNCRL AB 4-0 PS2 18 (SUTURE) ×3 IMPLANT
SUT VIC AB 0 CT1 27 (SUTURE)
SUT VIC AB 0 CT1 27XBRD ANBCTR (SUTURE) IMPLANT
SUT VIC AB 1 CTX 36 (SUTURE) ×3
SUT VIC AB 1 CTX36XBRD ANBCTR (SUTURE) ×1 IMPLANT
SUT VIC AB 2-0 CT1 27 (SUTURE) ×6
SUT VIC AB 2-0 CT1 TAPERPNT 27 (SUTURE) ×2 IMPLANT
SYR 50ML LL SCALE MARK (SYRINGE) ×3 IMPLANT
SYR CONTROL 10ML LL (SYRINGE) ×3 IMPLANT
TOWEL OR 17X24 6PK STRL BLUE (TOWEL DISPOSABLE) ×3 IMPLANT
TOWEL OR 17X26 10 PK STRL BLUE (TOWEL DISPOSABLE) ×3 IMPLANT
WATER STERILE IRR 1000ML POUR (IV SOLUTION) ×3 IMPLANT

## 2015-10-08 NOTE — Anesthesia Postprocedure Evaluation (Signed)
Anesthesia Post Note  Patient: Marilyn Hernandez  Procedure(s) Performed: Procedure(s) (LRB): LEFT TOTAL KNEE ARTHROPLASTY (Left)  Patient location during evaluation: PACU Anesthesia Type: Spinal Level of consciousness: oriented and awake and alert Pain management: pain level controlled Vital Signs Assessment: post-procedure vital signs reviewed and stable Respiratory status: spontaneous breathing, respiratory function stable and patient connected to nasal cannula oxygen Cardiovascular status: blood pressure returned to baseline and stable Postop Assessment: no headache, no backache and spinal receding Anesthetic complications: no    Last Vitals:  Vitals:   10/08/15 1430 10/08/15 1445  BP:  135/64  Pulse: (!) 59 (!) 48  Resp: 15 14  Temp:  36.7 C    Last Pain:  Vitals:   10/08/15 1500  TempSrc:   PainSc: 0-No pain                 Effie Berkshire

## 2015-10-08 NOTE — H&P (View-Only) (Signed)
TOTAL KNEE ADMISSION H&P  Patient is being admitted for left total knee arthroplasty.  Subjective:  Chief Complaint:left knee pain.  HPI: Marilyn Hernandez, 65 y.o. female, has a history of pain and functional disability in the left knee due to arthritis and has failed non-surgical conservative treatments for greater than 12 weeks to includeNSAID's and/or analgesics, corticosteriod injections and activity modification.  Onset of symptoms was gradual, starting >10 years ago with gradually worsening course since that time. The patient noted prior procedures on the knee to include  arthroscopy and menisectomy on the left knee(s).  Patient currently rates pain in the left knee(s) at 3 out of 10 with activity. Patient has night pain, worsening of pain with activity and weight bearing, pain that interferes with activities of daily living and crepitus.  Patient has evidence of subchondral sclerosis and joint space narrowing by imaging studies.  There is no active infection.  Patient Active Problem List   Diagnosis Date Noted  . DVT (deep venous thrombosis) ([place]) 04/07/2015  . S/P total knee replacement 03/19/2015  . S/P laparoscopic appendectomy 10/06/2011  . Acute appendicitis 09/17/2011  . HTN (hypertension) 06/05/2010  . SYNCOPE, HX OF 12/23/2009  . Rheumatoid arthritis(714.0) 12/17/2009  . PLEURAL EFFUSION, LEFT 11/21/2009  . CHEST PAIN-PAINFUL RESPIRATION 11/21/2009  . HYPERCHOLESTEROLEMIA 11/03/2009  . CORONARY ATHEROSCLEROSIS NATIVE CORONARY ARTERY 11/03/2009  . FIBRILLATION, ATRIAL 11/03/2009  . CORONARY ARTERY BYPASS GRAFT, HX OF 11/03/2009   Past Medical History:  Diagnosis Date  . Anemia 1989   "before hysterectomy"  . Complication of anesthesia    "took me a little longer to wakeup in 1979"  . COPD (chronic obstructive pulmonary disease) ([place])    denies 03/19/2015  . Coronary artery disease    s/p NSTEMI 09/2009; s/p CABG 09/2009 [facility] [place]);echo 12/12/2009: EF 55-60%,  normal RVF  . 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 GERD (gastroesophageal reflux disease)    occ  . History of gout   . Hyperlipidemia   . Hypertension   . NSTEMI (non-ST elevated myocardial infarction) ([place]) 09/2009  . Pleural effusion 09/2009   post CABG  . Pulmonary nodules   . RA (rheumatoid arthritis) ([place])    "took strong nutrition; working well for me" (03/19/2015)  . URI (upper respiratory infection)    hx    Past Surgical History:  Procedure Laterality Date  . ABDOMINAL HYSTERECTOMY  1989  . APPENDECTOMY    . CARDIAC CATHETERIZATION  09/2009  . CESAREAN SECTION  1977; 1979  . COLONOSCOPY  05/1999   video/notes 06/08/2010  . CORONARY ANGIOPLASTY    . CORONARY ARTERY BYPASS GRAFT  09/2009   LIMA to the LAD, SVG right coronary artery, SVG to obtuse marginal September 2011   . EYE SURGERY     eye procedure laser rupt vessels  . JOINT REPLACEMENT    . KNEE ARTHROSCOPY Bilateral    left; right; right  . LAPAROSCOPIC APPENDECTOMY  09/16/2011   Procedure: APPENDECTOMY LAPAROSCOPIC;  Surgeon: Zenovia Jarred, MD;  Location: [place];  Service: General;  Laterality: N/A;  . STAPEDECTOMY Right 1996  . TOTAL KNEE ARTHROPLASTY Right 03/19/2015  . TOTAL KNEE ARTHROPLASTY Right 03/19/2015   Procedure: RIGHT TOTAL KNEE ARTHROPLASTY;  Surgeon: Ninetta Lights, MD;  Location: [place];  Service: Orthopedics;  Laterality: Right;     (Not in a hospital admission) Allergies  Allergen Reactions  . Naproxen Sodium Itching and Rash    Social History  Substance Use Topics  . Smoking status: Never Smoker  . Smokeless tobacco: Never Used  . Alcohol use No    Family History  Problem Relation Age of Onset  . Heart disease Mother     bypass surgery in her 94s  . Arrhythmia Brother     atrial fibrillation     Review of Systems  Constitutional: Negative.   HENT: Negative.   Eyes: Negative.   Respiratory: Negative.   Cardiovascular: Negative.   Gastrointestinal: Negative.   Genitourinary:  Negative.   Musculoskeletal: Positive for joint pain.  Skin: Negative.   Neurological: Negative.   Endo/Heme/Allergies: Negative.   Psychiatric/Behavioral: Negative.     Objective:  Physical Exam  Constitutional: She is oriented to person, place, and time. She appears well-developed and well-nourished.  HENT:  Head: Normocephalic and atraumatic.  Eyes: EOM are normal. Pupils are equal, round, and reactive to light.  Neck: Normal range of motion. Neck supple.  Cardiovascular: Normal rate and regular rhythm.   Respiratory: Effort normal and breath sounds normal.  GI: Soft. Bowel sounds are normal.  Musculoskeletal:  On the left varus alignment.  Motion 3-95.  Tibiofemoral and patellofemoral crepitus.    Neurological: She is alert and oriented to person, place, and time.  Skin: Skin is warm and dry.  Psychiatric: She has a normal mood and affect. Her behavior is normal. Judgment and thought content normal.    Vital signs in last 24 hours: @VSRANGES @  Labs:   Estimated body mass index is 30.86 kg/m as calculated from the following:   Height as of 04/24/15: 5' 9.5" (1.765 m).   Weight as of 04/24/15: 96.2 kg (212 lb).   Imaging Review Plain radiographs demonstrate severe degenerative joint disease of the left knee(s). The overall alignment ismild varus. The bone quality appears to be fair for age and reported activity level.  Assessment/Plan:  End stage arthritis, left knee   The patient history, physical examination, clinical judgment of the provider and imaging studies are consistent with end stage degenerative joint disease of the left knee(s) and total knee arthroplasty is deemed medically necessary. The treatment options including medical management, injection therapy arthroscopy and arthroplasty were discussed at length. The risks and benefits of total knee arthroplasty were presented and reviewed. The risks due to aseptic loosening, infection, stiffness, patella tracking  problems, thromboembolic complications and other imponderables were discussed. The patient acknowledged the explanation, agreed to proceed with the plan and consent was signed. Patient is being admitted for inpatient treatment for surgery, pain control, PT, OT, prophylactic antibiotics, VTE prophylaxis, progressive ambulation and ADL's and discharge planning. The patient is planning to be discharged to skilled nursing facility

## 2015-10-08 NOTE — Progress Notes (Signed)
Orthopedic Tech Progress Note Patient Details:  Marilyn Hernandez 25-Mar-1950 [id number]  CPM Left Knee CPM Left Knee: On Left Knee Flexion (Degrees): 90 Left Knee Extension (Degrees): 0 Additional Comments: trapeze bar patient helper Viewed order friom doctor's order list  Hildred Priest 10/08/2015, 4:06 PM

## 2015-10-08 NOTE — Anesthesia Procedure Notes (Signed)
Procedure Name: MAC Date/Time: 10/08/2015 11:34 AM Performed by: Huey Romans ANN Oxygen Delivery Method: Simple face mask Intubation Type: IV induction

## 2015-10-08 NOTE — Transfer of Care (Signed)
Immediate Anesthesia Transfer of Care Note  Patient: Marilyn Hernandez  Procedure(s) Performed: Procedure(s): LEFT TOTAL KNEE ARTHROPLASTY (Left)  Patient Location: PACU  Anesthesia Type:Spinal  Level of Consciousness: awake, alert  and oriented  Airway & Oxygen Therapy: Patient Spontanous Breathing  Post-op Assessment: Report given to RN and Post -op Vital signs reviewed and stable  Post vital signs: Reviewed and stable  Last Vitals:  Vitals:   10/08/15 0925 10/08/15 1338  BP: (!) 148/65 122/67  Pulse: 72 71  Resp: 20 13  Temp: 36.8 C     Last Pain:  Vitals:   10/08/15 0925  TempSrc: Oral         Complications: No apparent anesthesia complications

## 2015-10-08 NOTE — Anesthesia Preprocedure Evaluation (Addendum)
Anesthesia Evaluation  Patient identified by MRN, date of birth, ID band Patient awake    Reviewed: Allergy & Precautions, NPO status , Patient's Chart, lab work & pertinent test results, reviewed documented beta blocker date and time   Airway Mallampati: I  TM Distance: >3 FB Neck ROM: Full    Dental  (+) Teeth Intact, Dental Advisory Given   Pulmonary COPD,    breath sounds clear to auscultation       Cardiovascular hypertension, Pt. on medications and Pt. on home beta blockers + CAD, + Past MI and + CABG   Rhythm:Regular Rate:Normal     Neuro/Psych negative neurological ROS  negative psych ROS   GI/Hepatic negative GI ROS, Neg liver ROS,   Endo/Other  negative endocrine ROS  Renal/GU negative Renal ROS  negative genitourinary   Musculoskeletal  (+) Arthritis , Osteoarthritis,    Abdominal   Peds negative pediatric ROS (+)  Hematology negative hematology ROS (+)   Anesthesia Other Findings   Reproductive/Obstetrics negative OB ROS                            Lab Results  Component Value Date   WBC 10.2 09/26/2015   HGB 13.4 09/26/2015   HCT 43.1 09/26/2015   MCV 90.7 09/26/2015   PLT 332 09/26/2015   Lab Results  Component Value Date   CREATININE 1.00 09/26/2015   BUN 15 09/26/2015   NA 139 09/26/2015   K 4.2 09/26/2015   CL 107 09/26/2015   CO2 22 09/26/2015   Lab Results  Component Value Date   INR 1.03 03/07/2015   INR 1.03 12/10/2009   INR 1.05 11/10/2009   09/2015 EKG: normal sinus rhythm, occasional PVC noted, unifocal.  Anesthesia Physical Anesthesia Plan  ASA: III  Anesthesia Plan: Spinal   Post-op Pain Management:    Induction: Intravenous  Airway Management Planned: Natural Airway  Additional Equipment:   Intra-op Plan: Utilization Of Total Body Hypothermia per surgeon request  Post-operative Plan:   Informed Consent: I have reviewed the  patients History and Physical, chart, labs and discussed the procedure including the risks, benefits and alternatives for the proposed anesthesia with the patient or authorized representative who has indicated his/her understanding and acceptance.     Plan Discussed with: CRNA  Anesthesia Plan Comments:        Anesthesia Quick Evaluation

## 2015-10-08 NOTE — Interval H&P Note (Signed)
History and Physical Interval Note:  10/08/2015 11:03 AM  Marilyn Hernandez  has presented today for surgery, with the diagnosis of djd left knee  The various methods of treatment have been discussed with the patient and family. After consideration of risks, benefits and other options for treatment, the patient has consented to  Procedure(s): LEFT TOTAL KNEE ARTHROPLASTY (Left) as a surgical intervention .  The patient's history has been reviewed, patient examined, no change in status, stable for surgery.  I have reviewed the patient's chart and labs.  Questions were answered to the patient's satisfaction.     Ninetta Lights

## 2015-10-08 NOTE — Anesthesia Procedure Notes (Signed)
Spinal  Patient location during procedure: OR Start time: 10/08/2015 11:33 AM End time: 10/08/2015 11:35 AM Staffing Anesthesiologist: Suella Broad D Preanesthetic Checklist Completed: patient identified, site marked, surgical consent, pre-op evaluation, timeout performed, IV checked, risks and benefits discussed and monitors and equipment checked Spinal Block Patient position: sitting Prep: ChloraPrep Patient monitoring: heart rate, continuous pulse ox, blood pressure and cardiac monitor Approach: midline Location: L4-5 Injection technique: single-shot Needle Needle type: Introducer and Spinocan  Needle gauge: 24 G Needle length: 9 cm Additional Notes Negative paresthesia. Negative blood return. Positive free-flowing CSF. Expiration date of kit checked and confirmed. Patient tolerated procedure well, without complications.

## 2015-10-09 ENCOUNTER — Encounter (HOSPITAL_COMMUNITY): Payer: Self-pay | Admitting: Orthopedic Surgery

## 2015-10-09 LAB — BASIC METABOLIC PANEL
Anion gap: 6 (ref 5–15)
BUN: 12 mg/dL (ref 6–20)
CHLORIDE: 107 mmol/L (ref 101–111)
CO2: 25 mmol/L (ref 22–32)
CREATININE: 0.84 mg/dL (ref 0.44–1.00)
Calcium: 8.4 mg/dL — ABNORMAL LOW (ref 8.9–10.3)
GFR calc Af Amer: >60 mL/min (ref >60)
GFR calc non Af Amer: >60 mL/min (ref >60)
GLUCOSE: 193 mg/dL — AB (ref 65–99)
POTASSIUM: 4.1 mmol/L (ref 3.5–5.1)
SODIUM: 138 mmol/L (ref 135–145)

## 2015-10-09 LAB — CBC
HCT: 32.4 % — ABNORMAL LOW (ref 36.0–46.0)
HEMOGLOBIN: 10.1 g/dL — AB (ref 12.0–15.0)
MCH: 28.1 pg (ref 26.0–34.0)
MCHC: 31.2 g/dL (ref 30.0–36.0)
MCV: 90 fL (ref 78.0–100.0)
Platelets: 295 10*3/uL (ref 150–400)
RBC: 3.6 MIL/uL — AB (ref 3.87–5.11)
RDW: 13.6 % (ref 11.5–15.5)
WBC: 12.6 10*3/uL — ABNORMAL HIGH (ref 4.0–10.5)

## 2015-10-09 MED ORDER — SENNA 8.6 MG PO TABS: 1.0000 | ORAL_TABLET | Freq: Every day | ORAL | 1 refills | Status: DC

## 2015-10-09 NOTE — Op Note (Signed)
NAME:  Marilyn Hernandez, Marilyn Hernandez NO.:  [id number]  MEDICAL RECORD NO.:  [id number]  LOCATION:  [id number]                        FACILITY:  [facility]  PHYSICIAN:  Ninetta Lights, M.D. DATE OF BIRTH:  1950/03/13  DATE OF PROCEDURE:  10/08/2015 DATE OF DISCHARGE:                              OPERATIVE REPORT   PREOPERATIVE DIAGNOSIS:  Left knee end-stage arthritis, primary generalized.  POSTOPERATIVE DIAGNOSES: 1. Left knee end-stage arthritis, primary generalized with numerous     loose bodies. 2. Flexion contracture.  PROCEDURES: 1. Left knee modified minimally invasive total knee replacement with     Stryker triathlon prosthesis.  Cemented pegged cruciate retaining     #5 femoral component.  Cemented #5 tibial component, 9 mm CS     insert.  Cemented resurfacing 35 mm patellar component. 2. Removal of numerous loose bodies.  SURGEON:  Ninetta Lights, M.D.  ASSISTANTDarcus Austin, PA, present throughout the entire case and necessary for timely completion of procedure.  ANESTHESIA:  Spinal.  BLOOD LOSS:  Minimal.  SPECIMENS:  None.  CULTURES:  None.  COMPLICATIONS:  None.  DRESSINGS:  Sterile compressive, knee immobilizer.  TOURNIQUET TIME:  1 hour.  DESCRIPTION OF PROCEDURE:  The patient was brought to the operating room, and after adequate anesthesia had been obtained, tourniquet applied.  Prepped and draped in the usual sterile fashion. Exsanguinated with elevation of Esmarch.  Tourniquet inflated to 350 mmHg.  Longitudinal incision above the patella and down the tibial tubercle.  A 5- to 7-degree flexion contracture.  Medial arthrotomy vastus splitting.  Knee exposed.  Flexible intramedullary guide, distal femur.  10 mm resection, 5 degrees of valgus.  After removing the spurs and numerous loose bodies and using epicondylar axis, the femur was sized, cut, and fitted for a cruciate retaining #5 component.  Proximal tibial resection with  extramedullary guide.  Size #5 component.  Patella exposed, spurs removed, drilled, sized, and fitted after appropriate resection for a 35 mm component.  Trials put in place.  9 mm insert. Nicely-balanced knee.  Full extension, full flexion, good alignment, good stability, good patellar tracking.  Tibia was marked for rotation and reamed.  All trials removed.  Copious irrigation with a pulse irrigating device.  Cement prepared, placed on all components, firmly seated.  Polyethylene attached to tibia, knee reduced.  Patella held with a clamp.  Once the cement hardened, the knee was irrigated again. Soft tissue was injected with Exparel.  Arthrotomy closed with #1 Vicryl.  Subcutaneous with subcuticular closure.  Margins were injected with Marcaine.  Sterile compressive dressing applied.  Anesthesia reversed.  Brought to the recovery room.  Tolerated the surgery well. No complications.     Ninetta Lights, M.D.     DFM/MEDQ  D:  10/08/2015  T:  10/09/2015  Job:  [id number]

## 2015-10-09 NOTE — Clinical Social Work Note (Signed)
CSW contacted [facility] admissions to determine bed availability.  Patient states she has been to [facility] in the past and wishes to return for STR.  CSW awaiting a return call.  Nonnie Done, MSW, LCSW  [phone]  Licensed Clinical Social Worker

## 2015-10-09 NOTE — Care Management Note (Addendum)
10/10/2015 NCM spoke to Marilyn Hernandez and Marilyn Hernandez at bedside. Provided updates on Eliquis copay prior to dc. Jonnie Finner RN CCM Case Mgmt phone [phone]  Case Management Note  Patient Details  Name: Marilyn Hernandez MRN: [id number] Date of Birth: 1950-02-28  Subjective/Objective:     S/p Left Total Knee               Action/Plan: Discharge Planning:  Chart reviewed. CSW following for SNF placement. NCM will check Eliquis benefits. Marilyn Hernandez has some concerns about copay once she dc from SNF.   PCP- Leonides Sake MD  S/W DANIEL @ [employer] [facility] # [id number]   ELIQUIS 2.5 MG BID 60 TAB    COVER-YES  CO-PAY- $40.00  TIER- 2 DRUG  PRIOR APPROVAL - YES # [phone]  [facility] : [facility]  MAIL-ORDER FOR 34 DAY SUPPLY $40.00   Expected Discharge Date:                 Expected Discharge Plan:  [place]  In-House Referral:  NA  Discharge planning Services  CM Consult, Medication Assistance  Post Acute Care Choice:  NA Choice offered to:  NA  DME Arranged:  N/A DME Agency:  NA  HH Arranged:  NA HH Agency:  NA  Status of Service:  Completed, signed off  If discussed at Long Length of Stay Meetings, dates discussed:    Additional Comments:  Erenest Rasher, RN 10/09/2015, 4:01 PM

## 2015-10-09 NOTE — NC FL2 (Signed)
[place] LEVEL OF CARE SCREENING TOOL     IDENTIFICATION  Patient Name: Marilyn Hernandez Birthdate: 03/07/1950 Sex: female Admission Date (Current Location): 10/08/2015  [facility] and [place] Number:  [employer] and Address:  [facility] [place], [place] [address] [place], [place] [address]      Provider Number: [id number]  Attending Physician Name and Address:  Ninetta Lights, MD  Relative Name and Phone Number:       Current Level of Care: Hospital Recommended Level of Care: [place] Prior Approval Number:    Date Approved/Denied:   PASRR Number: [id number]  Discharge Plan: SNF    Current Diagnoses: Patient Active Problem List   Diagnosis Date Noted  . Primary osteoarthritis of left knee 10/08/2015  . DVT (deep venous thrombosis) ([place]) 04/07/2015  . S/P total knee replacement 03/19/2015  . S/P laparoscopic appendectomy 10/06/2011  . Acute appendicitis 09/17/2011  . HTN (hypertension) 06/05/2010  . SYNCOPE, HX OF 12/23/2009  . Rheumatoid arthritis(714.0) 12/17/2009  . PLEURAL EFFUSION, LEFT 11/21/2009  . CHEST PAIN-PAINFUL RESPIRATION 11/21/2009  . HYPERCHOLESTEROLEMIA 11/03/2009  . CORONARY ATHEROSCLEROSIS NATIVE CORONARY ARTERY 11/03/2009  . FIBRILLATION, ATRIAL 11/03/2009  . CORONARY ARTERY BYPASS GRAFT, HX OF 11/03/2009    Orientation RESPIRATION BLADDER Height & Weight     Self, Time, Situation, Place  O2 (2L) Continent Weight: 230 lb (104.3 kg) Height:  5' 8.5" (174 cm)  BEHAVIORAL SYMPTOMS/MOOD NEUROLOGICAL BOWEL NUTRITION STATUS      Continent Diet (Heart healthy)  AMBULATORY STATUS COMMUNICATION OF NEEDS Skin   Limited Assist Verbally Surgical wounds                       Personal Care Assistance Level of Assistance  Bathing, Dressing Bathing Assistance: Limited assistance   Dressing Assistance: Limited assistance     Functional Limitations Info             SPECIAL CARE FACTORS  FREQUENCY  OT (By licensed OT), PT (By licensed PT)     PT Frequency: daily OT Frequency: daily            Contractures      Additional Factors Info  Code Status, Allergies Code Status Info: FULL Allergies Info: Naproxen Sodium           Current Medications (10/09/2015):  This is the current hospital active medication list Current Facility-Administered Medications  Medication Dose Route Frequency Provider Last Rate Last Dose  . acetaminophen (TYLENOL) tablet 650 mg  650 mg Oral Q6H PRN Ninetta Lights, MD   650 mg at 10/08/15 1419   Or  . acetaminophen (TYLENOL) suppository 650 mg  650 mg Rectal Q6H PRN Ninetta Lights, MD      . acetaminophen (TYLENOL) tablet 650 mg  650 mg Oral Q6H Ninetta Lights, MD   650 mg at 10/09/15 1124  . apixaban (ELIQUIS) tablet 2.5 mg  2.5 mg Oral Q12H Ninetta Lights, MD   2.5 mg at 10/09/15 0819  . aspirin tablet 325 mg  325 mg Oral Daily Ninetta Lights, MD   325 mg at 10/09/15 0818  . diphenhydrAMINE (BENADRYL) 12.5 MG/5ML elixir 12.5-25 mg  12.5-25 mg Oral Q4H PRN Ninetta Lights, MD      . docusate sodium (COLACE) capsule 100 mg  100 mg Oral BID Ninetta Lights, MD   100 mg at 10/09/15 0818  . furosemide (LASIX) tablet 20 mg  20 mg Oral Daily PRN Ninetta Lights, MD      . lactated ringers infusion   Intravenous Continuous Ninetta Lights, MD      . menthol-cetylpyridinium (CEPACOL) lozenge 3 mg  1 lozenge Oral PRN Ninetta Lights, MD       Or  . phenol (CHLORASEPTIC) mouth spray 1 spray  1 spray Mouth/Throat PRN Ninetta Lights, MD      . methocarbamol (ROBAXIN) tablet 500 mg  500 mg Oral Q6H PRN Ninetta Lights, MD   500 mg at 10/09/15 1124   Or  . methocarbamol (ROBAXIN) 500 mg in dextrose 5 % 50 mL IVPB  500 mg Intravenous Q6H PRN Ninetta Lights, MD      . metoCLOPramide (REGLAN) tablet 5-10 mg  5-10 mg Oral Q8H PRN Ninetta Lights, MD       Or  . metoCLOPramide (REGLAN) injection 5-10 mg  5-10 mg Intravenous Q8H PRN Ninetta Lights,  MD   10 mg at 10/08/15 1734  . metoprolol tartrate (LOPRESSOR) tablet 25 mg  25 mg Oral BID Ninetta Lights, MD   25 mg at 10/09/15 0819  . morphine 2 MG/ML injection 2 mg  2 mg Intravenous Q2H PRN Ninetta Lights, MD   2 mg at 10/08/15 1931  . nitroGLYCERIN (NITROSTAT) SL tablet 0.4 mg  0.4 mg Sublingual Q5 min PRN Ninetta Lights, MD      . ondansetron [facility]) tablet 4 mg  4 mg Oral Q6H PRN Ninetta Lights, MD       Or  . ondansetron [facility]) injection 4 mg  4 mg Intravenous Q6H PRN Ninetta Lights, MD   4 mg at 10/08/15 1559  . oxyCODONE (Oxy IR/ROXICODONE) immediate release tablet 5-10 mg  5-10 mg Oral Q3H PRN Ninetta Lights, MD   10 mg at 10/09/15 1124  . polyethylene glycol (MIRALAX / GLYCOLAX) packet 17 g  17 g Oral Daily PRN Ninetta Lights, MD   17 g at 10/09/15 0819  . potassium chloride (K-DUR,KLOR-CON) CR tablet 10 mEq  10 mEq Oral Daily PRN Ninetta Lights, MD      . rosuvastatin (CRESTOR) tablet 40 mg  40 mg Oral Q2200 Ninetta Lights, MD   40 mg at 10/08/15 2215  . senna (SENOKOT) tablet 8.6 mg  1 tablet Oral BID Ninetta Lights, MD   8.6 mg at 10/09/15 0818  . sorbitol 70 % solution 30 mL  30 mL Oral Daily PRN Ninetta Lights, MD         Discharge Medications: Please see discharge summary for a list of discharge medications.  Relevant Imaging Results:  Relevant Lab Results:   Additional Information SSN: [id number]  Dulcy Fanny, LCSW

## 2015-10-09 NOTE — Clinical Social Work Placement (Signed)
   CLINICAL SOCIAL WORK PLACEMENT  NOTE  Date:  10/09/2015  Patient Details  Name: Marilyn Hernandez MRN: [id number] Date of Birth: 07-19-1950  Clinical Social Work is seeking post-discharge placement for this patient at the [place] level of care (*CSW will initial, date and re-position this form in  chart as items are completed):  Yes   Patient/family provided with [facility] Work Department's list of facilities offering this level of care within the geographic area requested by the patient (or if unable, by the patient's family).  Yes   Patient/family informed of their freedom to choose among providers that offer the needed level of care, that participate in Medicare, Medicaid or managed care program needed by the patient, have an available bed and are willing to accept the patient.  Yes   Patient/family informed of [facility]'s ownership interest in [facility] and [facility], as well as of the fact that they are under no obligation to receive care at these facilities.  PASRR submitted to EDS on       PASRR number received on       Existing PASRR number confirmed on 10/09/15     FL2 transmitted to all facilities in geographic area requested by pt/family on 10/09/15     FL2 transmitted to all facilities within larger geographic area on       Patient informed that his/her managed care company has contracts with or will negotiate with certain facilities, including the following:        Yes   Patient/family informed of bed offers received.  Patient chooses bed at       Physician recommends and patient chooses bed at      Patient to be transferred to   on  .  Patient to be transferred to facility by       Patient family notified on   of transfer.  Name of family member notified:        PHYSICIAN Please sign FL2, Please prepare prescriptions     Additional Comment:    _______________________________________________ Dulcy Fanny, LCSW 10/09/2015, 12:59 PM

## 2015-10-09 NOTE — Discharge Summary (Signed)
Discharge Summary  Patient ID: Marilyn Hernandez MRN: [id number] DOB/AGE: 25-Apr-1950 65 y.o.  Admit date: 10/08/2015 Discharge date: 10/09/2015  Admission Diagnoses:  Primary osteoarthritis of left knee  Discharge Diagnoses:  Principal Problem:   Primary osteoarthritis of left knee Active Problems:   HYPERCHOLESTEROLEMIA   CORONARY ATHEROSCLEROSIS NATIVE CORONARY ARTERY   CORONARY ARTERY BYPASS GRAFT, HX OF   HTN (hypertension)   S/P total knee replacement   Past Medical History:  Diagnosis Date  . Anemia 1989   "before hysterectomy"  . Complication of anesthesia    "took me a little longer to wakeup in 1979"  . COPD (chronic obstructive pulmonary disease) ([place])    denies 03/19/2015  . Coronary artery disease    s/p NSTEMI 09/2009; s/p CABG 09/2009 [facility], );echo 12/12/2009: EF 55-60%, normal RVF  . DVT (deep venous thrombosis) ([place]) 02/2015   pt. states it's resolved per Dr. Lavonna Monarch  . 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 History of gout   . Hyperlipidemia   . Hypertension   . NSTEMI (non-ST elevated myocardial infarction) ([place]) 09/2009  . Pleural effusion 09/2009   post CABG  . Pulmonary nodules   . RA (rheumatoid arthritis) ([place])    "took strong nutrition; working well for me" (03/19/2015)  . URI (upper respiratory infection)    hx    Surgeries: Procedure(s): LEFT TOTAL KNEE ARTHROPLASTY on 10/08/2015   Consultants (if any):   Discharged Condition: Improved  Hospital Course: Marilyn COOMER is an 65 y.o. female who was admitted 10/08/2015 with a diagnosis of Primary osteoarthritis of left knee and went to the operating room on 10/08/2015 and underwent the above named procedures.    She was given perioperative antibiotics:  Anti-infectives    Start     Dose/Rate Route Frequency Ordered Stop   10/08/15 1945  ceFAZolin (ANCEF) IVPB 2g/100 mL premix     2 g 200 mL/hr over 30 Minutes Intravenous Every 6 hours 10/08/15 1343 10/09/15 0546   10/08/15 0920   ceFAZolin (ANCEF) 2-4 GM/100ML-% IVPB    Comments:  Ardine Eng   : cabinet override      10/08/15 0920 10/08/15 2129   10/08/15 0918  ceFAZolin (ANCEF) IVPB 2g/100 mL premix     2 g 200 mL/hr over 30 Minutes Intravenous On call to O.R. 10/08/15 [id number] 10/08/15 1136    .  She was given sequential compression devices, early ambulation, and Eliquis and Aspirin for DVT prophylaxis.  She benefited maximally from the hospital stay and there were no complications.    Recent vital signs:  Vitals:   10/09/15 0100 10/09/15 0444  BP: (!) 119/53 (!) 121/58  Pulse: (!) 53 (!) 57  Resp: 16 16  Temp: 97.6 F (36.4 C) 97.7 F (36.5 C)    Recent laboratory studies:  Lab Results  Component Value Date   HGB 10.1 (L) 10/09/2015   HGB 13.4 09/26/2015   HGB 10.1 (L) 03/27/2015   Lab Results  Component Value Date   WBC 12.6 (H) 10/09/2015   PLT 295 10/09/2015   Lab Results  Component Value Date   INR 1.03 03/07/2015   Lab Results  Component Value Date   NA 138 10/09/2015   K 4.1 10/09/2015   CL 107 10/09/2015   CO2 25 10/09/2015   BUN 12 10/09/2015   CREATININE 0.84 10/09/2015   GLUCOSE 193 (H) 10/09/2015    Discharge Medications:     Medication List  TAKE these medications   acetaminophen 325 MG tablet Commonly known as:  TYLENOL Take 325-650 mg by mouth every 6 (six) hours as needed for mild pain.   apixaban 2.5 MG Tabs tablet Commonly known as:  ELIQUIS Take 1 tablet (2.5 mg total) by mouth 2 (two) times daily. Take for 30 days.   aspirin EC 325 MG tablet Take 1 tablet (325 mg total) by mouth daily. Take for 30 days.  Resume 81 mg Aspirin thereafter. What changed:  medication strength  how much to take  additional instructions   bisacodyl 5 MG EC tablet Commonly known as:  DULCOLAX Take 1 tablet (5 mg total) by mouth daily as needed for moderate constipation.   Cholecalciferol 2000 UNT/0.03ML Liqd Take 0.5 Syringes by mouth 2 (two) times daily.   Co  Q 10 100 MG Caps Take 100 mg by mouth at bedtime.   docusate sodium 100 MG capsule Commonly known as:  COLACE Take 1 capsule (100 mg total) by mouth 2 (two) times daily. To prevent constipation while taking pain medication.   folic acid [id number] MCG tablet Commonly known as:  FOLVITE Take 400 mcg by mouth daily.   furosemide 20 MG tablet Commonly known as:  LASIX TAKE 1 TABLET (20 MG TOTAL) BY MOUTH AS NEEDED.   KLOR-CON M10 10 MEQ tablet Generic drug:  potassium chloride TAKE 1 TABLET BY MOUTH DAILY (8AM) What changed:  See the new instructions.   MAGNESIUM CITRATE PO Take 1 tablet by mouth daily.   methocarbamol 500 MG tablet Commonly known as:  ROBAXIN Take 1 tablet (500 mg total) by mouth every 6 (six) hours as needed for muscle spasms.   metoprolol tartrate 25 MG tablet Commonly known as:  LOPRESSOR TAKE 0.5 TABLETS (12.5 MG TOTAL) BY MOUTH 2 (TWO) TIMES DAILY.   nitroGLYCERIN 0.4 MG SL tablet Commonly known as:  NITROSTAT PLACE 1 TABLET UNDER TONGUE EVERY 5 MINUTES IF NEEDED FOR CHEST PAIN   omeprazole 20 MG capsule Commonly known as:  PRILOSEC Take 1 capsule (20 mg total) by mouth daily. While taking anti inflammatory medicine daily   ondansetron 4 MG tablet Commonly known as:  ZOFRAN Take 1 tablet (4 mg total) by mouth every 8 (eight) hours as needed for nausea or vomiting.   oxyCODONE-acetaminophen 5-325 MG tablet Commonly known as:  ROXICET Take 1-2 tablets by mouth every 4 (four) hours as needed. What changed:  Another medication with the same name was added. Make sure you understand how and when to take each.   oxyCODONE-acetaminophen 5-325 MG tablet Commonly known as:  ROXICET Take 1-2 tablets by mouth every 4 (four) hours as needed for severe pain. What changed:  You were already taking a medication with the same name, and this prescription was added. Make sure you understand how and when to take each.   PROBIOTIC PO Take 1 tablet by mouth daily.    rosuvastatin 40 MG tablet Commonly known as:  CRESTOR TAKE 1 TABLET (40 MG TOTAL) BY MOUTH DAILY.   senna 8.6 MG Tabs tablet Commonly known as:  SENOKOT Take 1 tablet (8.6 mg total) by mouth daily.   vitamin B-12 1000 MCG tablet Commonly known as:  CYANOCOBALAMIN Take 1,000 mcg by mouth daily. With folic acid [id number] mcg       Diagnostic Studies: Dg Knee 1-2 Views Left  Result Date: 10/08/2015 CLINICAL DATA:  Followup total knee arthroplasty EXAM: LEFT KNEE - 1-2 VIEW COMPARISON:  None. FINDINGS: Two-view show total knee  arthroplasty on the left. Components appear well positioned without radiographically detectable complication. Soft tissue swelling, small amount of joint fluid and air in the soft tissues as expected. IMPRESSION: Good appearance following total knee arthroplasty. Electronically Signed   By: Nelson Chimes M.D.   On: 10/08/2015 15:58    Disposition: 03-Skilled [place], MD. Schedule an appointment as soon as possible for a visit in 2 week(s).   Specialty:  Orthopedic Surgery Contact information: [address] [place] [address] [phone]            Signed: Prudencio Burly III PA-C 10/09/2015, 7:27 AM

## 2015-10-09 NOTE — Progress Notes (Signed)
Orthopedic Tech Progress Note Patient Details:  Marilyn Hernandez 19-Oct-1950 [id number]  Patient ID: Marilyn Hernandez, female   DOB: [birth year], 65 y.o.   MRN: [id number] Applied cpm 0-60  Karolee Stamps 10/09/2015, 6:35 AM

## 2015-10-09 NOTE — Progress Notes (Addendum)
   Assessment: 1 Day Post-Op  S/P Procedure(s) (LRB): LEFT TOTAL KNEE ARTHROPLASTY (Left) by Dr. Maryla Morrow on 10/08/15  Principal Problem:   Primary osteoarthritis of left knee Active Problems:   HYPERCHOLESTEROLEMIA   CORONARY ATHEROSCLEROSIS NATIVE CORONARY ARTERY   CORONARY ARTERY BYPASS GRAFT, HX OF   HTN (hypertension)   S/P total knee replacement Acute Blood loss anemia - likely with dilutional component.    Plan: Advance diet Up with therapy D/C IV fluids Discharge to SNF today pending PT evaluation  Weight Bearing: Weight Bearing as Tolerated (WBAT) left leg Dressings: Aquacel VTE prophylaxis: Aspirin and Eliquis, SCDs, ambulation Dispo: Skilled Nursing Facility/Rehab today pending PT evaluation.  Subjective: Patient reports pain as moderate and controlled with PO meds.  Tolerating liquids.  Urinating.  +Flatus.  No CP, SOB. Not yet OOB.  Objective:   VITALS:   Vitals:   10/08/15 1910 10/08/15 2215 10/09/15 0100 10/09/15 0444  BP: (!) 146/56 (!) 127/55 (!) 119/53 (!) 121/58  Pulse: 66 68 (!) 53 (!) 57  Resp: 15  16 16   Temp: 97.7 F (36.5 C)  97.6 F (36.4 C) 97.7 F (36.5 C)  TempSrc:   Oral Oral  SpO2: 97%  100% 100%  Weight:      Height:       CBC Latest Ref Rng & Units 10/09/2015 09/26/2015 03/27/2015  WBC 4.0 - 10.5 K/uL 12.6(H) 10.2 9.2  Hemoglobin 12.0 - 15.0 g/dL 10.1(L) 13.4 10.1(L)  Hematocrit 36.0 - 46.0 % 32.4(L) 43.1 30.4(L)  Platelets 150 - 400 K/uL 295 332 481(H)   BMP Latest Ref Rng & Units 10/09/2015 09/26/2015 03/27/2015  Glucose 65 - 99 mg/dL 193(H) 92 100(H)  BUN 6 - 20 mg/dL 12 15 15   Creatinine 0.44 - 1.00 mg/dL 0.84 1.00 0.90  Sodium 135 - 145 mmol/L 138 139 142  Potassium 3.5 - 5.1 mmol/L 4.1 4.2 4.4  Chloride 101 - 111 mmol/L 107 107 107  CO2 22 - 32 mmol/L 25 22 28   Calcium 8.9 - 10.3 mg/dL 8.4(L) 9.4 9.2   Intake/Output      09/13 0701 - 09/14 0700 09/14 0701 - 09/15 0700   I.V. (mL/kg) 2300 (22.1)    Total Intake(mL/kg) 2300  (22.1)    Urine (mL/kg/hr) 700    Blood 50    Total Output 750     Net +1550          Urine Occurrence 1 x      General: NAD.  Supine in bed CPM in place. Resp: No increased WOB. Cardio: Regular rate and rhythm.  No pedal edema ABD protuberant, soft Neurologically intact MSK Left foot warm. Neurovascularly intact Sensation intact distally Intact pulses distally Dorsiflexion/Plantar flexion intact Incision: dressing C/D/I   Charna Elizabeth Martensen III 10/09/2015, 7:21 AM

## 2015-10-09 NOTE — Evaluation (Signed)
Physical Therapy Evaluation Patient Details Name: Marilyn Hernandez MRN: [id number] DOB: 1950/06/06 Today's Date: 10/09/2015   History of Present Illness  pt presents with L TKR.  pt with hx of R TKR, HTN, CABG, DVT, RA, A-fib, CAD, COPD, MI, and Gout.    Clinical Impression  Pt will continue to need skilled therapy to maximize independence and decrease burden of care prior to returning to home.  Anticipate pt will make great progress.  Will continue to follow while on acute.      Follow Up Recommendations SNF    Equipment Recommendations  None recommended by PT    Recommendations for Other Services       Precautions / Restrictions Precautions Precautions: Fall Required Braces or Orthoses: Knee Immobilizer - Left Knee Immobilizer - Left: Discontinue once straight leg raise with < 10 degree lag Restrictions Weight Bearing Restrictions: Yes LLE Weight Bearing: Weight bearing as tolerated      Mobility  Bed Mobility Overal bed mobility: Needs Assistance Bed Mobility: Supine to Sit     Supine to sit: Supervision;HOB elevated     General bed mobility comments: Heavy reliance on UEs for bringing trunk up to sitting and to A with bringing L LE to EOB.    Transfers Overall transfer level: Needs assistance Equipment used: Rolling walker (2 wheeled) Transfers: Sit to/from Stand Sit to Stand: Min assist         General transfer comment: cues for UE use and positioning of LEs.  pt follows cues very well.    Ambulation/Gait Ambulation/Gait assistance: Min guard Ambulation Distance (Feet): 35 Feet Assistive device: Rolling walker (2 wheeled) Gait Pattern/deviations: Step-to pattern;Decreased step length - right;Decreased stance time - left;Trunk flexed     General Gait Details: cues for gait sequencing and more upright posture.  pt limited by pain and fatigue.    Stairs            Wheelchair Mobility    Modified Rankin (Stroke Patients Only)       Balance  Overall balance assessment: Needs assistance Sitting-balance support: No upper extremity supported;Feet supported Sitting balance-Leahy Scale: Good     Standing balance support: Bilateral upper extremity supported;Single extremity supported;During functional activity Standing balance-Leahy Scale: Poor                               Pertinent Vitals/Pain Pain Assessment: 0-10 Pain Score: 6  Pain Location: L knee during mobility Pain Descriptors / Indicators: Grimacing;Guarding;Sore Pain Intervention(s): Monitored during session;Premedicated before session;Repositioned    Home Living Family/patient expects to be discharged to:: Skilled nursing facility                      Prior Function Level of Independence: Independent with assistive device(s)               Hand Dominance   Dominant Hand: Right    Extremity/Trunk Assessment   Upper Extremity Assessment: Defer to OT evaluation           Lower Extremity Assessment: LLE deficits/detail   LLE Deficits / Details: AAROM ~10 - 65.  Strength limited by pain.  Sensation intact.  Cervical / Trunk Assessment: Normal  Communication   Communication: No difficulties  Cognition Arousal/Alertness: Awake/alert Behavior During Therapy: WFL for tasks assessed/performed Overall Cognitive Status: Within Functional Limits for tasks assessed  General Comments      Exercises Total Joint Exercises Ankle Circles/Pumps: AROM;Both;10 reps Quad Sets: AROM;Both;10 reps Hip ABduction/ADduction: AAROM;Left;10 reps Long Arc Quad: AAROM;Left;10 reps Knee Flexion: AAROM;Left;10 reps      Assessment/Plan    PT Assessment Patient needs continued PT services  PT Diagnosis Abnormality of gait;Acute pain   PT Problem List Decreased strength;Decreased range of motion;Decreased activity tolerance;Decreased balance;Decreased mobility;Decreased knowledge of use of DME;Pain  PT Treatment  Interventions DME instruction;Gait training;Stair training;Functional mobility training;Therapeutic activities;Therapeutic exercise;Balance training;Patient/family education   PT Goals (Current goals can be found in the Care Plan section) Acute Rehab PT Goals Patient Stated Goal: Get better PT Goal Formulation: With patient Time For Goal Achievement: 10/16/15 Potential to Achieve Goals: Good    Frequency 7X/week   Barriers to discharge        Co-evaluation               End of Session Equipment Utilized During Treatment: Gait belt;Left knee immobilizer Activity Tolerance: Patient tolerated treatment well Patient left: in chair;with call bell/phone within reach Nurse Communication: Mobility status         Time: [id number] PT Time Calculation (min) (ACUTE ONLY): 43 min   Charges:   PT Evaluation $PT Eval Moderate Complexity: 1 Procedure PT Treatments $Gait Training: 8-22 mins $Therapeutic Exercise: 8-22 mins   PT G CodesCatarina Hartshorn, [place] 10/09/2015, 12:20 PM

## 2015-10-09 NOTE — Discharge Instructions (Signed)
Information on my medicine - ELIQUIS (apixaban)  This medication education was reviewed with me or my healthcare representative as part of my discharge preparation.  The pharmacist that spoke with me during my hospital stay was:  Eudelia Bunch, [facility]  Why was Eliquis prescribed for you? Eliquis was prescribed for you to reduce the risk of blood clots forming after orthopedic surgery.    What do You need to know about Eliquis? Take your Eliquis TWICE DAILY - one tablet in the morning and one tablet in the evening with or without food.  It would be best to take the dose about the same time each day.  If you have difficulty swallowing the tablet whole please discuss with your pharmacist how to take the medication safely.  Take Eliquis exactly as prescribed by your doctor and DO NOT stop taking Eliquis without talking to the doctor who prescribed the medication.  Stopping without other medication to take the place of Eliquis may increase your risk of developing a clot.  After discharge, you should have regular check-up appointments with your healthcare provider that is prescribing your Eliquis.  What do you do if you miss a dose? If a dose of ELIQUIS is not taken at the scheduled time, take it as soon as possible on the same day and twice-daily administration should be resumed.  The dose should not be doubled to make up for a missed dose.  Do not take more than one tablet of ELIQUIS at the same time.  Important Safety Information A possible side effect of Eliquis is bleeding. You should call your healthcare provider right away if you experience any of the following: ? Bleeding from an injury or your nose that does not stop. ? Unusual colored urine (red or dark brown) or unusual colored stools (red or black). ? Unusual bruising for unknown reasons. ? A serious fall or if you hit your head (even if there is no bleeding).  Some medicines may interact with Eliquis and might increase  your risk of bleeding or clotting while on Eliquis. To help avoid this, consult your healthcare provider or pharmacist prior to using any new prescription or non-prescription medications, including herbals, vitamins, non-steroidal anti-inflammatory drugs (NSAIDs) and supplements.  This website has more information on Eliquis (apixaban): http://www.eliquis.com/eliquis/homeInformation on my medicine - ELIQUIS (apixaban)  This medication education was reviewed with me or my healthcare representative as part of my discharge preparation.  The pharmacist that spoke with me during my hospital stay was:  Eudelia Bunch, [facility]  Why was Eliquis prescribed for you? Eliquis was prescribed for you to reduce the risk of blood clots forming after orthopedic surgery.    What do You need to know about Eliquis? Take your Eliquis TWICE DAILY - one tablet in the morning and one tablet in the evening with or without food.  It would be best to take the dose about the same time each day.  If you have difficulty swallowing the tablet whole please discuss with your pharmacist how to take the medication safely.  Take Eliquis exactly as prescribed by your doctor and DO NOT stop taking Eliquis without talking to the doctor who prescribed the medication.  Stopping without other medication to take the place of Eliquis may increase your risk of developing a clot.  After discharge, you should have regular check-up appointments with your healthcare provider that is prescribing your Eliquis.  What do you do if you miss a dose? If a dose of ELIQUIS  is not taken at the scheduled time, take it as soon as possible on the same day and twice-daily administration should be resumed.  The dose should not be doubled to make up for a missed dose.  Do not take more than one tablet of ELIQUIS at the same time.  Important Safety Information A possible side effect of Eliquis is bleeding. You should call your healthcare provider  right away if you experience any of the following: ? Bleeding from an injury or your nose that does not stop. ? Unusual colored urine (red or dark brown) or unusual colored stools (red or black). ? Unusual bruising for unknown reasons. ? A serious fall or if you hit your head (even if there is no bleeding).  Some medicines may interact with Eliquis and might increase your risk of bleeding or clotting while on Eliquis. To help avoid this, consult your healthcare provider or pharmacist prior to using any new prescription or non-prescription medications, including herbals, vitamins, non-steroidal anti-inflammatory drugs (NSAIDs) and supplements.  This website has more information on Eliquis (apixaban): http://www.eliquis.com/eliquis/homeINSTRUCTIONS AFTER JOINT REPLACEMENT   o Remove items at home which could result in a fall. This includes throw rugs or furniture in walking pathways o ICE to the affected joint every three hours while awake for 30 minutes at a time, for at least the first 3-5 days, and then as needed for pain and swelling.  Continue to use ice for pain and swelling. You may notice swelling that will progress down to the foot and ankle.  This is normal after surgery.  Elevate your leg when you are not up walking on it.   o Continue to use the breathing machine you got in the hospital (incentive spirometer) which will help keep your temperature down.  It is common for your temperature to cycle up and down following surgery, especially at night when you are not up moving around and exerting yourself.  The breathing machine keeps your lungs expanded and your temperature down.   DIET:  As you were doing prior to hospitalization, we recommend a well-balanced diet.  DRESSING / WOUND CARE / SHOWERING  Keep the surgical dressing until follow up.  IF THE DRESSING FALLS OFF or the wound gets wet inside, change the dressing with sterile gauze.  Please use good hand washing techniques before  changing the dressing.  Do not use any lotions or creams on the incision until instructed by your surgeon.    ACTIVITY  o Increase activity slowly as tolerated, but follow the weight bearing instructions below.   o No driving for 6 weeks or until further direction given by your physician.  You cannot drive while taking narcotics.  o No lifting or carrying greater than 10 lbs. until further directed by your surgeon. o Avoid periods of inactivity such as sitting longer than an hour when not asleep. This helps prevent blood clots.  o You may return to work once you are authorized by your doctor.     WEIGHT BEARING   Weight bearing as tolerated with assist device (walker, cane, etc) as directed, use it as long as suggested by your surgeon or therapist, typically at least 4-6 weeks.   EXERCISES  Results after joint replacement surgery are often greatly improved when you follow the exercise, range of motion and muscle strengthening exercises prescribed by your doctor. Safety measures are also important to protect the joint from further injury. Any time any of these exercises cause you to have increased  pain or swelling, decrease what you are doing until you are comfortable again and then slowly increase them. If you have problems or questions, call your caregiver or physical therapist for advice.   Rehabilitation is important following a joint replacement. After just a few days of immobilization, the muscles of the leg can become weakened and shrink (atrophy).  These exercises are designed to build up the tone and strength of the thigh and leg muscles and to improve motion. Often times heat used for twenty to thirty minutes before working out will loosen up your tissues and help with improving the range of motion but do not use heat for the first two weeks following surgery (sometimes heat can increase post-operative swelling).   These exercises can be done on a training (exercise) mat, on the  floor, on a table or on a bed. Use whatever works the best and is most comfortable for you.    Use music or television while you are exercising so that the exercises are a pleasant break in your day. This will make your life better with the exercises acting as a break in your routine that you can look forward to.   Perform all exercises about fifteen times, three times per day or as directed.  You should exercise both the operative leg and the other leg as well.  Exercises include:    Quad Sets - Tighten up the muscle on the front of the thigh (Quad) and hold for 5-10 seconds.    Straight Leg Raises - With your knee straight (if you were given a brace, keep it on), lift the leg to 60 degrees, hold for 3 seconds, and slowly lower the leg.  Perform this exercise against resistance later as your leg gets stronger.   Leg Slides: Lying on your back, slowly slide your foot toward your buttocks, bending your knee up off the floor (only go as far as is comfortable). Then slowly slide your foot back down until your leg is flat on the floor again.   Angel Wings: Lying on your back spread your legs to the side as far apart as you can without causing discomfort.   Hamstring Strength:  Lying on your back, push your heel against the floor with your leg straight by tightening up the muscles of your buttocks.  Repeat, but this time bend your knee to a comfortable angle, and push your heel against the floor.  You may put a pillow under the heel to make it more comfortable if necessary.   A rehabilitation program following joint replacement surgery can speed recovery and prevent re-injury in the future due to weakened muscles. Contact your doctor or a physical therapist for more information on knee rehabilitation.    CONSTIPATION  Constipation is defined medically as fewer than three stools per week and severe constipation as less than one stool per week.  Even if you have a regular bowel pattern at home, your  normal regimen is likely to be disrupted due to multiple reasons following surgery.  Combination of anesthesia, postoperative narcotics, change in appetite and fluid intake all can affect your bowels.   YOU MUST use at least one of the following options; they are listed in order of increasing strength to get the job done.  They are all available over the counter, and you may need to use some, POSSIBLY even all of these options:    Drink plenty of fluids (prune juice may be helpful) and high fiber foods Colace  100 mg by mouth twice a day  Senokot for constipation as directed and as needed Dulcolax (bisacodyl), take with full glass of water  Miralax (polyethylene glycol) once or twice a day as needed.  If you have tried all these things and are unable to have a bowel movement in the first 3-4 days after surgery call either your surgeon or your primary doctor.    If you experience loose stools or diarrhea, hold the medications until you stool forms back up.  If your symptoms do not get better within 1 week or if they get worse, check with your doctor.  If you experience "the worst abdominal pain ever" or develop nausea or vomiting, please contact the office immediately for further recommendations for treatment.   ITCHING:  If you experience itching with your medications, try taking only a single pain pill, or even half a pain pill at a time.  You can also use Benadryl over the counter for itching or also to help with sleep.   TED HOSE STOCKINGS:  Use stockings on both legs until for at least 2 weeks or as directed by physician office. They may be removed at night for sleeping.  MEDICATIONS:  See your medication summary on the After Visit Summary that nursing will review with you.  You may have some home medications which will be placed on hold until you complete the course of blood thinner medication.  It is important for you to complete the blood thinner medication as prescribed.  PRECAUTIONS:   If you experience chest pain or shortness of breath - call 911 immediately for transfer to the hospital emergency department.   If you develop a fever greater that 101 F, purulent drainage from wound, increased redness or drainage from wound, foul odor from the wound/dressing, or calf pain - CONTACT YOUR SURGEON.                                                   FOLLOW-UP APPOINTMENTS:  If you do not already have a post-op appointment, please call the office for an appointment to be seen by your surgeon.  Guidelines for how soon to be seen are listed in your After Visit Summary, but are typically between 1-4 weeks after surgery.  OTHER INSTRUCTIONS:   Knee Replacement:  Do not place pillow under knee, focus on keeping the knee straight while resting. CPM instructions: 0-90 degrees, 2 hours in the morning, 2 hours in the afternoon, and 2 hours in the evening. Place foam block, curve side up under heel at all times except when in CPM or when walking.  DO NOT modify, tear, cut, or change the foam block in any way.  MAKE SURE YOU:   Understand these instructions.   Get help right away if you are not doing well or get worse.    Thank you for letting us be a part of your medical care team.  It is a privilege we respect greatly.  We hope these instructions will help you stay on track for a fast and full recovery!

## 2015-10-10 ENCOUNTER — Encounter
Admission: RE | Admit: 2015-10-10 | Discharge: 2015-10-10 | Disposition: A | Discharge status: Home / Self Care | Payer: Medicare Other | Source: Ambulatory Visit | Attending: Internal Medicine | Admitting: Internal Medicine

## 2015-10-10 NOTE — Significant Event (Signed)
Attempting to call report to receiving facility-no answered. Left VM for admission. Will attempt again.

## 2015-10-10 NOTE — Clinical Social Work Placement (Signed)
   CLINICAL SOCIAL WORK PLACEMENT  NOTE  Date:  10/10/2015  Patient Details  Name: Marilyn Hernandez MRN: [id number] Date of Birth: 1950-07-30  Clinical Social Work is seeking post-discharge placement for this patient at the [place] level of care (*CSW will initial, date and re-position this form in  chart as items are completed):  Yes   Patient/family provided with [facility] Work Department's list of facilities offering this level of care within the geographic area requested by the patient (or if unable, by the patient's family).  Yes   Patient/family informed of their freedom to choose among providers that offer the needed level of care, that participate in Medicare, Medicaid or managed care program needed by the patient, have an available bed and are willing to accept the patient.  Yes   Patient/family informed of [facility]'s ownership interest in [facility] and [facility], as well as of the fact that they are under no obligation to receive care at these facilities.  PASRR submitted to EDS on       PASRR number received on       Existing PASRR number confirmed on 10/09/15     FL2 transmitted to all facilities in geographic area requested by pt/family on 10/09/15     FL2 transmitted to all facilities within larger geographic area on       Patient informed that his/her managed care company has contracts with or will negotiate with certain facilities, including the following:        Yes   Patient/family informed of bed offers received.  Patient chooses bed at [facility]     Physician recommends and patient chooses bed at      Patient to be transferred to [facility] on 10/10/15.  Patient to be transferred to facility by husband     Patient family notified on 10/10/15 of transfer.  Name of family member notified:  patient and husband both updated     PHYSICIAN Please sign FL2, Please prepare prescriptions     Additional  Comment:    _______________________________________________ Dulcy Fanny, LCSW 10/10/2015, 11:39 AM

## 2015-10-10 NOTE — Clinical Social Work Note (Signed)
Patient will discharge today per MD order. Patient will discharge to: [facility] RN to call report prior to transportation to: [phone] Transportation: husband- arranged for discharge after patient eats lunch RN aware.  CSW sent discharge summary to SNF for review.    Nonnie Done, MSW, LCSW  ([id number]) [phone]  Licensed Clinical Social Worker

## 2015-10-10 NOTE — Clinical Social Work Note (Signed)
Clinical Social Work Assessment  Patient Details  Name: Marilyn Hernandez MRN: [id number] Date of Birth: [birth year]  Date of referral:  10/10/15               Reason for consult:  Facility Placement                Permission sought to share information with:  Chartered certified accountant granted to share information::  Yes, Verbal Permission Granted  Name::        Agency::   ([facility])  Relationship::     Contact Information:     Housing/Transportation Living arrangements for the past 2 months:  [place] of Information:  Patient Patient Interpreter Needed:  None Criminal Activity/Legal Involvement Pertinent to Current Situation/Hospitalization:  No - Comment as needed Significant Relationships:  Spouse Lives with:  Spouse Do you feel safe going back to the place where you live?  No Need for family participation in patient care:  No (Coment)  Care giving concerns:  None:Patient and husband are agreeable to SNF.    Social Worker assessment / plan:  CSW met with patient to review disposition plans.  Patient states she was at [facility] for her last knee replacement and would like to return status post this knee surgery.  [facility] aware and able to offer a bed.  Patient is requesting husband to transport.  SNF and RN aware.  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  Patient/Family's Understanding of and Emotional Response to Diagnosis, Current Treatment, and Prognosis:  Patient is less anxious during this admission.  Patient states she is more comfortable this time due to her having her other knee operated on last year.  Patient is calm and presented cooperative and knowledgeable regarding this process.    Emotional Assessment Appearance:  Appears stated age Attitude/Demeanor/Rapport:    Affect (typically observed):  Accepting Orientation:  Oriented to Self, Oriented to Place, Oriented to  Time, Oriented to Situation Alcohol / Substance use:  Never Used Psych involvement (Current and /or in the community):  No (Comment)  Discharge Needs  Concerns to be addressed:  No discharge needs identified Readmission within the last 30 days:  No Current discharge risk:  None Barriers to Discharge:  No Barriers Identified   Dulcy Fanny, LCSW 10/10/2015, 11:34 AM

## 2015-10-10 NOTE — Significant Event (Addendum)
Late Note:   Report given to Nichelle at 1335pm. All questions answered

## 2015-10-10 NOTE — Progress Notes (Signed)
Orthopedic Tech Progress Note Patient Details:  Marilyn Hernandez 11-28-1950 [id number]  Patient ID: Marilyn Hernandez, female   DOB: December 22, 1950, 65 y.o.   MRN: [id number] Applied cpm 0-60  Karolee Stamps 10/10/2015, 6:14 AM

## 2015-10-10 NOTE — Significant Event (Signed)
Patient discharge to facility [facility]), to be taken there by patient's spouse.   AVS reviewed with patient and her spouse. Paper prescriptions and AVS given to them. No personal belongings in room to take with them. Patient's spouse brought in clothes for patient to wear. Patient taken to transportation by volunteer.

## 2015-10-10 NOTE — Evaluation (Signed)
Occupational Therapy Evaluation Patient Details Name: Marilyn Hernandez MRN: [id number] DOB: Dec 07, 1950 Today's Date: 10/10/2015    History of Present Illness pt presents with L TKR.  pt with hx of R TKR, HTN, CABG, DVT, RA, A-fib, CAD, COPD, MI, and Gout.     Clinical Impression   Pt is at mod A level with LB ADLs and min guard A with ADL mobility. Pt planning to d/c to SNF for rehab before returning home. Pt with hx of knee surgery and and short term SNF therapy. No further acute OT indicated at this time, will defer any further OT intervention to SNF    Follow Up Recommendations  SNF    Equipment Recommendations  None recommended by OT    Recommendations for Other Services       Precautions / Restrictions Precautions Precautions: Fall Required Braces or Orthoses: Knee Immobilizer - Left Knee Immobilizer - Left: Discontinue once straight leg raise with < 10 degree lag Restrictions Weight Bearing Restrictions: Yes LLE Weight Bearing: Weight bearing as tolerated      Mobility Bed Mobility               General bed mobility comments: pt up in recliner  Transfers Overall transfer level: Needs assistance Equipment used: Rolling walker (2 wheeled) Transfers: Sit to/from Stand Sit to Stand: Min guard         General transfer comment: pt demonstrated good technique with return to sitting.      Balance Overall balance assessment: Needs assistance Sitting-balance support: No upper extremity supported;Feet supported Sitting balance-Leahy Scale: Good     Standing balance support: Single extremity supported;Bilateral upper extremity supported;During functional activity Standing balance-Leahy Scale: Poor                              ADL Overall ADL's : Needs assistance/impaired     Grooming: Wash/dry hands;Wash/dry face;Min guard;Standing   Upper Body Bathing: Set up;Sitting   Lower Body Bathing: Moderate assistance   Upper Body Dressing :  Set up;Sitting   Lower Body Dressing: Moderate assistance   Toilet Transfer: Min guard;RW;Comfort height toilet;Grab bars;Ambulation   Toileting- Clothing Manipulation and Hygiene: Minimal assistance;Sit to/from stand       Functional mobility during ADLs: Min guard;Rolling walker General ADL Comments: Pt aware of ADL A/E from previous ortho surgery     Vision  wears glasses, no change from baseline              Pertinent Vitals/Pain Pain Assessment: 0-10 Pain Score: 7  Pain Location: L knee Pain Descriptors / Indicators: Aching;Sore Pain Intervention(s): Monitored during session;Premedicated before session     Hand Dominance Right   Extremity/Trunk Assessment Upper Extremity Assessment Upper Extremity Assessment: Overall WFL for tasks assessed           Communication Communication Communication: No difficulties   Cognition Arousal/Alertness: Awake/alert Behavior During Therapy: WFL for tasks assessed/performed Overall Cognitive Status: Within Functional Limits for tasks assessed                     General Comments   pt very pleasant and cooperative                Home Living Family/patient expects to be discharged to:: Skilled nursing facility  Prior Functioning/Environment Level of Independence: Independent with assistive device(s)        Comments: sometimes using a cane to ambulate. Independent with ADLs and home mgt    OT Diagnosis:   S/P total knee arthroplasty - Plan: DG Knee 1-2 Views Left, DG Knee 1-2 Views Left  Pain in left knee    OT Problem List:     OT Treatment/Interventions:      OT Goals(Current goals can be found in the care plan section) Acute Rehab OT Goals Patient Stated Goal: Get better OT Goal Formulation: With patient  OT Frequency:     Barriers to D/C: Decreased caregiver support  pt plans to d/c to SNF                     End of  Session Equipment Utilized During Treatment: Gait belt;Left knee immobilizer;Other (comment) (3 in 1) CPM Left Knee CPM Left Knee: Off  Activity Tolerance: Patient tolerated treatment well Patient left: Other (comment) (with PT at [facility])   Time: [id number] OT Time Calculation (min): 23 min Charges:  OT General Charges $OT Visit: 1 Procedure OT Evaluation $OT Eval Moderate Complexity: 1 Procedure G-Codes:    Britt Bottom 10/10/2015, 12:46 PM

## 2015-10-10 NOTE — Progress Notes (Signed)
Physical Therapy Treatment Patient Details Name: Marilyn Hernandez MRN: [id number] DOB: 09-05-1950 Today's Date: 10/10/2015    History of Present Illness pt presents with L TKR.  pt with hx of R TKR, HTN, CABG, DVT, RA, A-fib, CAD, COPD, MI, and Gout.      PT Comments    Pt motivated and able to increase ambulation distance today.  Pt with increased knee stiffness today, but participates well with ROM.  Continue to feel pt will make great progress with SNF level of therapies.  Will continue to follow while on acute.    Follow Up Recommendations  SNF     Equipment Recommendations  None recommended by PT    Recommendations for Other Services       Precautions / Restrictions Precautions Precautions: Fall Required Braces or Orthoses: Knee Immobilizer - Left Knee Immobilizer - Left: Discontinue once straight leg raise with < 10 degree lag Restrictions Weight Bearing Restrictions: Yes LLE Weight Bearing: Weight bearing as tolerated    Mobility  Bed Mobility               General bed mobility comments: pt up with OT.  Transfers Overall transfer level: Needs assistance Equipment used: Rolling walker (2 wheeled) Transfers: Sit to/from Stand Sit to Stand: Min guard         General transfer comment: pt demonstrated good technique with return to sitting.    Ambulation/Gait Ambulation/Gait assistance: Min guard Ambulation Distance (Feet): 100 Feet Assistive device: Rolling walker (2 wheeled) Gait Pattern/deviations: Step-to pattern;Step-through pattern;Decreased stride length     General Gait Details: pt beginning to work towards more step-through gait pattern.  cues for upright posture.     Stairs            Wheelchair Mobility    Modified Rankin (Stroke Patients Only)       Balance Overall balance assessment: Needs assistance Sitting-balance support: No upper extremity supported;Feet supported Sitting balance-Leahy Scale: Good     Standing balance  support: Single extremity supported;Bilateral upper extremity supported;During functional activity Standing balance-Leahy Scale: Poor                      Cognition Arousal/Alertness: Awake/alert Behavior During Therapy: WFL for tasks assessed/performed Overall Cognitive Status: Within Functional Limits for tasks assessed                      Exercises Total Joint Exercises Quad Sets: AROM;Both;10 reps Short Arc Quad: AROM;Left;10 reps Hip ABduction/ADduction: AAROM;Left;10 reps Long Arc Quad: AAROM;Left;10 reps Knee Flexion: AAROM;Left;10 reps Goniometric ROM: ~ 10 - 60    General Comments        Pertinent Vitals/Pain Pain Assessment: 0-10 Pain Score: 5  Pain Location: L knee Pain Descriptors / Indicators: Aching;Grimacing;Guarding Pain Intervention(s): Monitored during session;Premedicated before session;Repositioned    Home Living                      Prior Function            PT Goals (current goals can now be found in the care plan section) Acute Rehab PT Goals Patient Stated Goal: Get better PT Goal Formulation: With patient Time For Goal Achievement: 10/16/15 Potential to Achieve Goals: Good Progress towards PT goals: Progressing toward goals    Frequency   PT Diagnosis  7X/week   S/P total knee arthroplasty - Plan: DG Knee 1-2 Views Left, DG Knee 1-2 Views Left  PT Plan Current plan remains appropriate    Co-evaluation             End of Session Equipment Utilized During Treatment: Gait belt;Left knee immobilizer Activity Tolerance: Patient tolerated treatment well Patient left: in chair;with call bell/phone within reach     Time: 1030-1050 PT Time Calculation (min) (ACUTE ONLY): 20 min  Charges:  $Gait Training: 8-22 mins                    G CodesCatarina Hartshorn, [place] 10/10/2015, 12:16 PM

## 2015-10-24 ENCOUNTER — Non-Acute Institutional Stay (SKILLED_NURSING_FACILITY): Payer: Medicare Other | Admitting: Gerontology

## 2015-10-24 DIAGNOSIS — Z96652 Presence of left artificial knee joint: Secondary | ICD-10-CM | POA: Diagnosis not present

## 2015-10-26 ENCOUNTER — Encounter
Admission: RE | Admit: 2015-10-26 | Discharge: 2015-10-26 | Disposition: A | Discharge status: Home / Self Care | Payer: Medicare Other | Source: Ambulatory Visit | Attending: Internal Medicine | Admitting: Internal Medicine

## 2015-10-27 NOTE — Progress Notes (Signed)
Location:      Place of Service:  SNF (31)  Provider: Toni Arthurs, NP-C  PCP: Leonides Sake, MD Patient Care Team: Leonides Sake, MD as PCP - General (Family Medicine)  Extended Emergency Contact Information Primary Emergency Contact: Bryson Corona Address: [phone], [place] Phone: [phone] Work Phone: [phone] Mobile Phone: [phone] Relation: Spouse Secondary Emergency Contact: Truddie Crumble [place] Phone: [phone] Relation: Son  Code Status: full Goals of care:  Advanced Directive information Advanced Directives 09/26/2015  Does patient have an advance directive? No  Does patient want to make changes to advanced directive? -  Would patient like information on creating an advanced directive? Yes - Educational materials given  Pre-existing out of facility DNR order (yellow form or pink MOST form) -     Allergies  Allergen Reactions  . Naproxen Sodium Itching and Rash    Chief Complaint  Patient presents with  . Discharge Note    HPI:  65 y.o. female at the facility for rehab following a Left Total Knee Replacement. Pt was at the facility in February for a Right TKA. Pt has progressed well with therapy. Currently using CPM. Pt reports pain as well controlled. Bowels normal. Progressing well with PT. VSS. No other complaints.     Past Medical History:  Diagnosis Date  . Anemia 1989   "before hysterectomy"  . Complication of anesthesia    "took me a little longer to wakeup in 1979"  . COPD (chronic obstructive pulmonary disease) ([place])    denies 03/19/2015  . Coronary artery disease    s/p NSTEMI 09/2009; s/p CABG 09/2009 [facility] [place]);echo 12/12/2009: EF 55-60%, normal RVF  . DVT (deep venous thrombosis) ([place]) 02/2015   pt. states it's resolved per Dr. Lavonna Monarch  . 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 History of gout   . Hyperlipidemia   . Hypertension   .  NSTEMI (non-ST elevated myocardial infarction) ([place]) 09/2009  . Pleural effusion 09/2009   post CABG  . Pulmonary nodules   . RA (rheumatoid arthritis) ([place])    "took strong nutrition; working well for me" (03/19/2015)  . URI (upper respiratory infection)    hx    Past Surgical History:  Procedure Laterality Date  . ABDOMINAL HYSTERECTOMY  1989  . APPENDECTOMY    . CARDIAC CATHETERIZATION  09/2009  . CESAREAN SECTION  1977; 1979  . COLONOSCOPY  05/1999   video/notes 06/08/2010  . CORONARY ANGIOPLASTY    . CORONARY ARTERY BYPASS GRAFT  09/2009   LIMA to the LAD, SVG right coronary artery, SVG to obtuse marginal September 2011   . EYE SURGERY     eye procedure laser rupt vessels  . JOINT REPLACEMENT    . KNEE ARTHROSCOPY Bilateral    left; right; right  . LAPAROSCOPIC APPENDECTOMY  09/16/2011   Procedure: APPENDECTOMY LAPAROSCOPIC;  Surgeon: Zenovia Jarred, MD;  Location: [facility];  Service: General;  Laterality: N/A;  . STAPEDECTOMY Right 1996  . TOTAL KNEE ARTHROPLASTY Right 03/19/2015  . TOTAL KNEE ARTHROPLASTY Right 03/19/2015   Procedure: RIGHT TOTAL KNEE ARTHROPLASTY;  Surgeon: Ninetta Lights, MD;  Location: [facility];  Service: Orthopedics;  Laterality: Right;  . TOTAL KNEE ARTHROPLASTY Left 10/08/2015   Procedure: LEFT TOTAL KNEE ARTHROPLASTY;  Surgeon: Ninetta Lights, MD;  Location: [place];  Service: Orthopedics;  Laterality: Left;  reports that she has never smoked. She has never used smokeless tobacco. She reports that she does not drink alcohol or use drugs. Social History   Social History  . Marital status: Married    Spouse name: N/A  . Number of children: 2  . Years of education: N/A   Occupational History  .  Unemployed   Social History Main Topics  . Smoking status: Never Smoker  . Smokeless tobacco: Never Used  . Alcohol use No  . Drug use: No  . Sexual activity: Not Currently   Other Topics Concern  . Not on file   Social History Narrative  . No  narrative on file   Functional Status Survey:    Allergies  Allergen Reactions  . Naproxen Sodium Itching and Rash    Pertinent  Health Maintenance Due  Topic Date Due  . COLONOSCOPY  03/13/1999  . INFLUENZA VACCINE  08/26/2015  . PNA vac Low Risk Adult (2 of 2 - PPSV23) 09/26/2015  . MAMMOGRAM  02/17/2017  . DEXA SCAN  Completed    Medications:   Medication List       Accurate as of 10/24/15 11:59 PM. Always use your most recent med list.          acetaminophen 325 MG tablet Commonly known as:  TYLENOL Take 325-650 mg by mouth every 6 (six) hours as needed for mild pain.   apixaban 2.5 MG Tabs tablet Commonly known as:  ELIQUIS Take 1 tablet (2.5 mg total) by mouth 2 (two) times daily. Take for 30 days.   aspirin EC 325 MG tablet Take 1 tablet (325 mg total) by mouth daily. Take for 30 days.  Resume 81 mg Aspirin thereafter.   bisacodyl 5 MG EC tablet Commonly known as:  DULCOLAX Take 1 tablet (5 mg total) by mouth daily as needed for moderate constipation.   Cholecalciferol 2000 UNT/0.03ML Liqd Take 0.5 Syringes by mouth 2 (two) times daily.   Co Q 10 100 MG Caps Take 100 mg by mouth at bedtime.   docusate sodium 100 MG capsule Commonly known as:  COLACE Take 1 capsule (100 mg total) by mouth 2 (two) times daily. To prevent constipation while taking pain medication.   folic acid Q000111Q MCG tablet Commonly known as:  FOLVITE Take 400 mcg by mouth daily.   furosemide 20 MG tablet Commonly known as:  LASIX TAKE 1 TABLET (20 MG TOTAL) BY MOUTH AS NEEDED.   KLOR-CON M10 10 MEQ tablet Generic drug:  potassium chloride TAKE 1 TABLET BY MOUTH DAILY (8AM)   MAGNESIUM CITRATE PO Take 1 tablet by mouth daily.   methocarbamol 500 MG tablet Commonly known as:  ROBAXIN Take 1 tablet (500 mg total) by mouth every 6 (six) hours as needed for muscle spasms.   metoprolol tartrate 25 MG tablet Commonly known as:  LOPRESSOR TAKE 0.5 TABLETS (12.5 MG TOTAL) BY  MOUTH 2 (TWO) TIMES DAILY.   nitroGLYCERIN 0.4 MG SL tablet Commonly known as:  NITROSTAT PLACE 1 TABLET UNDER TONGUE EVERY 5 MINUTES IF NEEDED FOR CHEST PAIN   omeprazole 20 MG capsule Commonly known as:  PRILOSEC Take 1 capsule (20 mg total) by mouth daily. While taking anti inflammatory medicine daily   ondansetron 4 MG tablet Commonly known as:  ZOFRAN Take 1 tablet (4 mg total) by mouth every 8 (eight) hours as needed for nausea or vomiting.   oxyCODONE-acetaminophen 5-325 MG tablet Commonly known as:  ROXICET Take 1-2 tablets by mouth  every 4 (four) hours as needed.   oxyCODONE-acetaminophen 5-325 MG tablet Commonly known as:  ROXICET Take 1-2 tablets by mouth every 4 (four) hours as needed for severe pain.   PROBIOTIC PO Take 1 tablet by mouth daily.   rosuvastatin 40 MG tablet Commonly known as:  CRESTOR TAKE 1 TABLET (40 MG TOTAL) BY MOUTH DAILY.   senna 8.6 MG Tabs tablet Commonly known as:  SENOKOT Take 1 tablet (8.6 mg total) by mouth daily.   vitamin B-12 1000 MCG tablet Commonly known as:  CYANOCOBALAMIN Take 1,000 mcg by mouth daily. With folic acid [id number] mcg       Review of Systems  Constitutional: Negative for activity change, appetite change, chills, diaphoresis and fever.  HENT: Negative for congestion, sneezing, sore throat, trouble swallowing and voice change.   Respiratory: Negative for apnea, cough, choking, chest tightness, shortness of breath and wheezing.   Cardiovascular: Negative for chest pain, palpitations and leg swelling.  Gastrointestinal: Negative for abdominal distention, abdominal pain, constipation, diarrhea and nausea.  Genitourinary: Negative for difficulty urinating, dysuria, frequency and urgency.  Musculoskeletal: Negative for back pain, gait problem and myalgias. Arthralgias: typical arthritis.  Skin: Positive for wound (Left Knee Incision). Negative for color change, pallor and rash.  Neurological: Negative for dizziness,  tremors, syncope, speech difficulty, weakness, numbness and headaches.  Psychiatric/Behavioral: Negative for agitation and behavioral problems.  All other systems reviewed and are negative.   Vitals:   10/24/15 0500  BP: (!) 153/74  Pulse: 61  Resp: 20  Temp: 97.7 F (36.5 C)  SpO2: 100%   There is no height or weight on file to calculate BMI. Physical Exam  Constitutional: She is oriented to person, place, and time. Vital signs are normal. She appears well-developed and well-nourished. She is active and cooperative. She does not appear ill. No distress.  HENT:  Head: Normocephalic and atraumatic.  Mouth/Throat: Uvula is midline, oropharynx is clear and moist and mucous membranes are normal. Mucous membranes are not pale, not dry and not cyanotic.  Eyes: Conjunctivae, EOM and lids are normal. Pupils are equal, round, and reactive to light.  Neck: Trachea normal, normal range of motion and full passive range of motion without pain. Neck supple. No JVD present. No tracheal deviation, no edema and no erythema present. No thyromegaly present.  Cardiovascular: Normal rate, regular rhythm, normal heart sounds, intact distal pulses and normal pulses.  Exam reveals no gallop, no distant heart sounds and no friction rub.   No murmur heard. Pulmonary/Chest: Effort normal and breath sounds normal. No accessory muscle usage. No respiratory distress. She has no wheezes. She exhibits no tenderness.  Abdominal: Normal appearance and bowel sounds are normal. She exhibits no distension and no ascites. There is no tenderness.  Musculoskeletal: She exhibits no edema.       Left knee: She exhibits decreased range of motion, swelling and laceration (incision). Tenderness found.  Expected osteoarthritis, stiffness; calves soft, supple. Negative Homan's sign  Neurological: She is alert and oriented to person, place, and time. She has normal strength.  Skin: Skin is warm and dry. Laceration (Left knee  incision) noted. No rash noted. She is not diaphoretic. No cyanosis or erythema. No pallor. Nails show no clubbing.  Psychiatric: She has a normal mood and affect. Her speech is normal and behavior is normal. Judgment and thought content normal. Cognition and memory are normal.  Nursing note and vitals reviewed.   Labs reviewed: Basic Metabolic Panel:  Recent Labs  03/27/15  [id number] 09/26/15 0955 10/09/15 0323  NA 142 139 138  K 4.4 4.2 4.1  CL 107 107 107  CO2 28 22 25   GLUCOSE 100* 92 193*  BUN 15 15 12   CREATININE 0.90 1.00 0.84  CALCIUM 9.2 9.4 8.4*   Liver Function Tests:  Recent Labs  03/07/15 1139  AST 20  ALT 13*  ALKPHOS 91  BILITOT 0.3  PROT 7.3  ALBUMIN 3.4*   No results for input(s): LIPASE, AMYLASE in the last 8760 hours. No results for input(s): AMMONIA in the last 8760 hours. CBC:  Recent Labs  03/07/15 1139  03/27/15 0847 09/26/15 0955 10/09/15 0323  WBC 9.5  < > 9.2 10.2 12.6*  NEUTROABS 6.2  --  6.3  --   --   HGB 13.3  < > 10.1* 13.4 10.1*  HCT 41.7  < > 30.4* 43.1 32.4*  MCV 85.1  < > 82.8 90.7 90.0  PLT 315  < > 481* 332 295  < > = values in this interval not displayed. Cardiac Enzymes: No results for input(s): CKTOTAL, CKMB, CKMBINDEX, TROPONINI in the last 8760 hours. BNP: Invalid input(s): POCBNP CBG: No results for input(s): GLUCAP in the last 8760 hours.  Procedures and Imaging Studies During Stay: Dg Knee 1-2 Views Left  Result Date: 10/08/2015 CLINICAL DATA:  Followup total knee arthroplasty EXAM: LEFT KNEE - 1-2 VIEW COMPARISON:  None. FINDINGS: Two-view show total knee arthroplasty on the left. Components appear well positioned without radiographically detectable complication. Soft tissue swelling, small amount of joint fluid and air in the soft tissues as expected. IMPRESSION: Good appearance following total knee arthroplasty. Electronically Signed   By: Nelson Chimes M.D.   On: 10/08/2015 15:58    Assessment/Plan:   1. Status  post total left knee replacement  Continue PT exercises at instructed  Follow up with orthopedist as instructed  Ankle pumps  Knee presses  Continue taking Eliquis  TED Hose  CPM  Ice pack prn for pain  Continue Percocet 5/325 mg 1-2 tabs po Q 4 hours prn pain. # 15, no refills  Methocarbamol 500 mg po Q 6 hours prn   Patient is being discharged with the following home health services:  Outpatient PT     Patient is being discharged with the following durable medical equipment:  [facility]  Patient has been advised to f/u with their PCP in 1-2 weeks to bring them up to date on their rehab stay.  Social services at facility was responsible for arranging this appointment.  Pt was provided with a 30 day supply of prescriptions for medications and refills must be obtained from their PCP.  For controlled substances, a more limited supply may be provided adequate until PCP appointment only.  Future labs/tests needed:    Family/ staff Communication:   Total Time:  Documentation:  Face to Face:  Family/Phone:  Vikki Ports, NP-C [facility] [address], [place] [address] Cell Phone (Mon-Fri 8am-5pm):  [phone] On Call:  [phone] & follow prompts after 5pm & weekends Office Phone:  [phone] Office Fax:  [phone]

## 2015-12-02 ENCOUNTER — Other Ambulatory Visit: Payer: Self-pay | Admitting: Cardiology

## 2015-12-03 NOTE — Telephone Encounter (Signed)
Rx has been sent to the pharmacy electronically. ° °

## 2016-01-01 ENCOUNTER — Other Ambulatory Visit: Payer: Self-pay | Admitting: Cardiology

## 2016-01-12 ENCOUNTER — Other Ambulatory Visit: Payer: Self-pay

## 2016-01-12 MED ORDER — POTASSIUM CHLORIDE CRYS ER 10 MEQ PO TBCR: EXTENDED_RELEASE_TABLET | ORAL | 1 refills | Status: DC

## 2016-01-12 MED ORDER — METOPROLOL TARTRATE 25 MG PO TABS: ORAL_TABLET | ORAL | 1 refills | Status: DC

## 2016-05-15 ENCOUNTER — Other Ambulatory Visit: Payer: Self-pay | Admitting: Cardiology

## 2016-09-06 ENCOUNTER — Other Ambulatory Visit: Payer: Self-pay | Admitting: Cardiology

## 2016-09-22 ENCOUNTER — Other Ambulatory Visit: Payer: Self-pay | Admitting: Cardiology

## 2016-10-01 ENCOUNTER — Encounter: Payer: Self-pay | Admitting: Gastroenterology

## 2016-10-06 ENCOUNTER — Other Ambulatory Visit: Payer: Self-pay | Admitting: Physician Assistant

## 2016-10-06 DIAGNOSIS — Z1231 Encounter for screening mammogram for malignant neoplasm of breast: Secondary | ICD-10-CM

## 2016-11-15 ENCOUNTER — Encounter (INDEPENDENT_AMBULATORY_CARE_PROVIDER_SITE_OTHER): Payer: Self-pay

## 2016-11-15 ENCOUNTER — Encounter: Payer: Self-pay | Admitting: Gastroenterology

## 2016-11-15 ENCOUNTER — Ambulatory Visit (AMBULATORY_SURGERY_CENTER): Payer: Self-pay | Admitting: *Deleted

## 2016-11-15 VITALS — Ht 69.0 in | Wt 243.0 lb

## 2016-11-15 DIAGNOSIS — Z800 Family history of malignant neoplasm of digestive organs: Secondary | ICD-10-CM

## 2016-11-15 MED ORDER — NA SULFATE-K SULFATE-MG SULF 17.5-3.13-1.6 GM/177ML PO SOLN
1.0000 | Freq: Once | ORAL | 0 refills | Status: AC
Start: 2016-11-15 — End: 2016-11-15

## 2016-11-15 NOTE — Progress Notes (Signed)
No egg or soy allergy known to patient  No issues with past sedation with any surgeries  or procedures, no intubation problems  No diet pills per patient No home 02 use per patient  No blood thinners per patient  Pt denies issues with constipation  No A fib or A flutter  EMMI video sent to pt's e mail pt declined   

## 2016-11-18 ENCOUNTER — Telehealth: Payer: Self-pay | Admitting: *Deleted

## 2016-11-18 ENCOUNTER — Telehealth: Payer: Self-pay | Admitting: Cardiology

## 2016-11-18 NOTE — Telephone Encounter (Signed)
Spoke with pt about her blood work, pt stated she haven't been taking her Crestor for a couple months now because it has cause her muscle cramps, she stated she is taking red yeast rice and she is going to start working on her diet

## 2016-11-18 NOTE — Telephone Encounter (Signed)
Marilyn Hernandez is returning your call . Thanks

## 2016-11-18 NOTE — Telephone Encounter (Signed)
-----   Message from Minus Breeding, MD sent at 11/16/2016  1:37 PM EDT ----- Her LDL was not good at 157.  She is apparently taking Crestor 40 mg.  If she is compliant with that med she needs to be seen in our Lipid Clinic to discuss PCSK9 inhibitor.  Call Ms. Urbieta with the results and send results to Cyndi Bender, PA-C

## 2016-11-19 NOTE — Telephone Encounter (Signed)
Spoke with Ms Goyal about her results

## 2016-11-29 ENCOUNTER — Ambulatory Visit (AMBULATORY_SURGERY_CENTER): Payer: Medicare Other | Admitting: Gastroenterology

## 2016-11-29 ENCOUNTER — Encounter: Payer: Self-pay | Admitting: Gastroenterology

## 2016-11-29 VITALS — BP 136/69 | HR 56 | Temp 97.7°F | Resp 15 | Ht 69.0 in | Wt 243.0 lb

## 2016-11-29 DIAGNOSIS — Z1212 Encounter for screening for malignant neoplasm of rectum: Secondary | ICD-10-CM

## 2016-11-29 DIAGNOSIS — Z1211 Encounter for screening for malignant neoplasm of colon: Secondary | ICD-10-CM | POA: Diagnosis not present

## 2016-11-29 DIAGNOSIS — Z800 Family history of malignant neoplasm of digestive organs: Secondary | ICD-10-CM

## 2016-11-29 DIAGNOSIS — D3615 Benign neoplasm of peripheral nerves and autonomic nervous system of abdomen: Secondary | ICD-10-CM | POA: Diagnosis not present

## 2016-11-29 DIAGNOSIS — K5730 Diverticulosis of large intestine without perforation or abscess without bleeding: Secondary | ICD-10-CM

## 2016-11-29 DIAGNOSIS — D123 Benign neoplasm of transverse colon: Secondary | ICD-10-CM

## 2016-11-29 DIAGNOSIS — K6389 Other specified diseases of intestine: Secondary | ICD-10-CM | POA: Diagnosis not present

## 2016-11-29 MED ORDER — SODIUM CHLORIDE 0.9 % IV SOLN: 500.0000 mL | INTRAVENOUS | Status: DC

## 2016-11-29 NOTE — Progress Notes (Signed)
Report to PACU, RN, vss, BBS= Clear.  

## 2016-11-29 NOTE — Progress Notes (Signed)
Called to room to assist during endoscopic procedure.  Patient ID and intended procedure confirmed with present staff. Received instructions for my participation in the procedure from the performing physician.  

## 2016-11-29 NOTE — Patient Instructions (Signed)
YOU HAD AN ENDOSCOPIC PROCEDURE TODAY AT THE [facility]:   Refer to the procedure report that was given to you for any specific questions about what was found during the examination.  If the procedure report does not answer your questions, please call your gastroenterologist to clarify.  If you requested that your care partner not be given the details of your procedure findings, then the procedure report has been included in a sealed envelope for you to review at your convenience later.  YOU SHOULD EXPECT: Some feelings of bloating in the abdomen. Passage of more gas than usual.  Walking can help get rid of the air that was put into your GI tract during the procedure and reduce the bloating. If you had a lower endoscopy (such as a colonoscopy or flexible sigmoidoscopy) you may notice spotting of blood in your stool or on the toilet paper. If you underwent a bowel prep for your procedure, you may not have a normal bowel movement for a few days.  Please Note:  You might notice some irritation and congestion in your nose or some drainage.  This is from the oxygen used during your procedure.  There is no need for concern and it should clear up in a day or so.  SYMPTOMS TO REPORT IMMEDIATELY:   Following lower endoscopy (colonoscopy or flexible sigmoidoscopy):  Excessive amounts of blood in the stool  Significant tenderness or worsening of abdominal pains  Swelling of the abdomen that is new, acute  Fever of 100F or higher    For urgent or emergent issues, a gastroenterologist can be reached at any hour by calling [phone].   DIET:  We do recommend a small meal at first, but then you may proceed to your regular diet.  Drink plenty of fluids but you should avoid alcoholic beverages for 24 hours.  ACTIVITY:  You should plan to take it easy for the rest of today and you should NOT DRIVE or use heavy machinery until tomorrow (because of the sedation medicines used during the test).     FOLLOW UP: Our staff will call the number listed on your records the next business day following your procedure to check on you and address any questions or concerns that you may have regarding the information given to you following your procedure. If we do not reach you, we will leave a message.  However, if you are feeling well and you are not experiencing any problems, there is no need to return our call.  We will assume that you have returned to your regular daily activities without incident.  If any biopsies were taken you will be contacted by phone or by letter within the next 1-3 weeks.  Please call us at [phone] if you have not heard about the biopsies in 3 weeks.    SIGNATURES/CONFIDENTIALITY: You and/or your care partner have signed paperwork which will be entered into your electronic medical record.  These signatures attest to the fact that that the information above on your After Visit Summary has been reviewed and is understood.  Full responsibility of the confidentiality of this discharge information lies with you and/or your care-partner.   Resume medications. Information given on polyps and diverticulosis. 

## 2016-11-29 NOTE — Op Note (Signed)
[place] Patient Name: Marilyn Hernandez Procedure Date: 11/29/2016 10:54 AM MRN: [id number] Endoscopist: Milus Banister , MD Age: 66 Referring MD:  Date of Birth: [birth year] Gender: Female Account #: [id number] Procedure:                Colonoscopy Indications:              Screening for colorectal malignant neoplasm; father                            had colon cancer in his 67s Medicines:                Monitored Anesthesia Care Procedure:                Pre-Anesthesia Assessment:                           - Prior to the procedure, a History and Physical                            was performed, and patient medications and                            allergies were reviewed. The patient's tolerance of                            previous anesthesia was also reviewed. The risks                            and benefits of the procedure and the sedation                            options and risks were discussed with the patient.                            All questions were answered, and informed consent                            was obtained. Prior Anticoagulants: The patient has                            taken no previous anticoagulant or antiplatelet                            agents. ASA Grade Assessment: II - A patient with                            mild systemic disease. After reviewing the risks                            and benefits, the patient was deemed in                            satisfactory condition to undergo the procedure.  After obtaining informed consent, the colonoscope                            was passed under direct vision. Throughout the                            procedure, the patient's blood pressure, pulse, and                            oxygen saturations were monitored continuously. The                            Colonoscope was introduced through the anus and                            advanced to the the cecum,  identified by                            appendiceal orifice and ileocecal valve. The                            colonoscopy was performed without difficulty. The                            patient tolerated the procedure well. The quality                            of the bowel preparation was good. The ileocecal                            valve, appendiceal orifice, and rectum were                            photographed. Scope In: 10:58:29 AM Scope Out: 11:10:29 AM Scope Withdrawal Time: 0 hours 10 minutes 31 seconds  Total Procedure Duration: 0 hours 12 minutes 0 seconds  Findings:                 A 3 mm polyp was found in the transverse colon. The                            polyp was sessile. The polyp was removed with a                            cold snare. Resection and retrieval were complete.                           Multiple small and large-mouthed diverticula were                            found in the entire colon (most significant in the                            left colon).  The exam was otherwise without abnormality on                            direct and retroflexion views. Complications:            No immediate complications. Estimated blood loss:                            None. Estimated Blood Loss:     Estimated blood loss: none. Impression:               - One 3 mm polyp in the transverse colon, removed                            with a cold snare. Resected and retrieved.                           - Diverticulosis in the entire examined colon.                           - The examination was otherwise normal on direct                            and retroflexion views. Recommendation:           - Patient has a contact number available for                            emergencies. The signs and symptoms of potential                            delayed complications were discussed with the                            patient. Return to normal  activities tomorrow.                            Written discharge instructions were provided to the                            patient.                           - Resume previous diet.                           - Continue present medications.                           You will receive a letter within 2-3 weeks with the                            pathology results and my final recommendations.                           If the polyp(s) is proven to be 'pre-cancerous' on  pathology, you will need repeat colonoscopy in 5                            years. Milus Banister, MD 11/29/2016 11:12:43 AM This report has been signed electronically.

## 2016-11-29 NOTE — Progress Notes (Signed)
Pt's states no medical or surgical changes since previsit or office visit. 

## 2016-11-30 ENCOUNTER — Telehealth: Payer: Self-pay | Admitting: *Deleted

## 2016-11-30 NOTE — Telephone Encounter (Signed)
  Follow up Call-  Call back number 11/29/2016  Post procedure Call Back phone  # [phone]  Permission to leave phone message Yes  Some recent data might be hidden     Patient questions:  Do you have a fever, pain , or abdominal swelling? No. Pain Score  0 *  Have you tolerated food without any problems? Yes.    Have you been able to return to your normal activities? Yes.    Do you have any questions about your discharge instructions: Diet   No. Medications  No. Follow up visit  No.  Do you have questions or concerns about your Care? No.  Actions: * If pain score is 4 or above: No action needed, pain <4.

## 2016-12-05 ENCOUNTER — Encounter: Payer: Self-pay | Admitting: Gastroenterology

## 2016-12-22 ENCOUNTER — Ambulatory Visit
Admission: RE | Admit: 2016-12-22 | Discharge: 2016-12-22 | Disposition: A | Discharge status: Home / Self Care | Payer: Medicare Other | Source: Ambulatory Visit | Attending: Physician Assistant | Admitting: Physician Assistant

## 2016-12-22 DIAGNOSIS — Z1231 Encounter for screening mammogram for malignant neoplasm of breast: Secondary | ICD-10-CM

## 2018-07-25 ENCOUNTER — Telehealth: Payer: Self-pay | Admitting: *Deleted

## 2018-07-25 NOTE — Telephone Encounter (Signed)
A message was left, re: follow up visit. 

## 2018-11-13 ENCOUNTER — Telehealth: Payer: Self-pay | Admitting: *Deleted

## 2018-11-13 NOTE — Telephone Encounter (Signed)
A message was left, re: her follow up visit. 

## 2018-11-30 ENCOUNTER — Telehealth: Payer: Self-pay | Admitting: *Deleted

## 2018-11-30 NOTE — Telephone Encounter (Signed)
We have left several message, re: her follow up visit.

## 2019-11-09 ENCOUNTER — Other Ambulatory Visit: Payer: Self-pay | Admitting: Nurse Practitioner

## 2019-11-09 DIAGNOSIS — Z0000 Encounter for general adult medical examination without abnormal findings: Secondary | ICD-10-CM

## 2019-11-09 DIAGNOSIS — H5211 Myopia, right eye: Secondary | ICD-10-CM | POA: Diagnosis not present

## 2019-11-09 DIAGNOSIS — H524 Presbyopia: Secondary | ICD-10-CM | POA: Diagnosis not present

## 2019-11-09 DIAGNOSIS — H2513 Age-related nuclear cataract, bilateral: Secondary | ICD-10-CM | POA: Diagnosis not present

## 2019-11-09 DIAGNOSIS — H52221 Regular astigmatism, right eye: Secondary | ICD-10-CM | POA: Diagnosis not present

## 2019-11-09 DIAGNOSIS — H5202 Hypermetropia, left eye: Secondary | ICD-10-CM | POA: Diagnosis not present

## 2019-12-06 ENCOUNTER — Other Ambulatory Visit: Payer: Self-pay

## 2019-12-06 ENCOUNTER — Ambulatory Visit
Admission: RE | Admit: 2019-12-06 | Discharge: 2019-12-06 | Disposition: A | Discharge status: Home / Self Care | Payer: Medicare Other | Source: Ambulatory Visit | Attending: Nurse Practitioner | Admitting: Nurse Practitioner

## 2019-12-06 DIAGNOSIS — Z1231 Encounter for screening mammogram for malignant neoplasm of breast: Secondary | ICD-10-CM | POA: Diagnosis not present

## 2019-12-06 DIAGNOSIS — Z0000 Encounter for general adult medical examination without abnormal findings: Secondary | ICD-10-CM

## 2020-02-02 DIAGNOSIS — U071 COVID-19: Secondary | ICD-10-CM | POA: Diagnosis not present

## 2020-02-02 DIAGNOSIS — R059 Cough, unspecified: Secondary | ICD-10-CM | POA: Diagnosis not present

## 2020-06-05 DIAGNOSIS — Z6837 Body mass index (BMI) 37.0-37.9, adult: Secondary | ICD-10-CM | POA: Diagnosis not present

## 2020-06-05 DIAGNOSIS — R6889 Other general symptoms and signs: Secondary | ICD-10-CM | POA: Diagnosis not present

## 2020-06-05 DIAGNOSIS — Z20822 Contact with and (suspected) exposure to covid-19: Secondary | ICD-10-CM | POA: Diagnosis not present

## 2020-06-25 DIAGNOSIS — H906 Mixed conductive and sensorineural hearing loss, bilateral: Secondary | ICD-10-CM | POA: Diagnosis not present

## 2020-06-26 DIAGNOSIS — L299 Pruritus, unspecified: Secondary | ICD-10-CM | POA: Diagnosis not present

## 2020-06-26 DIAGNOSIS — S30860A Insect bite (nonvenomous) of lower back and pelvis, initial encounter: Secondary | ICD-10-CM | POA: Diagnosis not present

## 2020-06-26 DIAGNOSIS — W57XXXA Bitten or stung by nonvenomous insect and other nonvenomous arthropods, initial encounter: Secondary | ICD-10-CM | POA: Diagnosis not present

## 2020-06-26 DIAGNOSIS — R519 Headache, unspecified: Secondary | ICD-10-CM | POA: Diagnosis not present

## 2020-06-26 DIAGNOSIS — Z6835 Body mass index (BMI) 35.0-35.9, adult: Secondary | ICD-10-CM | POA: Diagnosis not present

## 2020-09-17 DIAGNOSIS — U071 COVID-19: Secondary | ICD-10-CM | POA: Diagnosis not present

## 2021-01-13 ENCOUNTER — Other Ambulatory Visit: Payer: Self-pay | Admitting: Nurse Practitioner

## 2021-01-13 ENCOUNTER — Other Ambulatory Visit: Payer: Self-pay | Admitting: Family Medicine

## 2021-01-13 DIAGNOSIS — Z1231 Encounter for screening mammogram for malignant neoplasm of breast: Secondary | ICD-10-CM

## 2021-02-12 ENCOUNTER — Ambulatory Visit
Admission: RE | Admit: 2021-02-12 | Discharge: 2021-02-12 | Disposition: A | Discharge status: Home / Self Care | Payer: Medicare PPO | Source: Ambulatory Visit | Attending: Family Medicine | Admitting: Family Medicine

## 2021-02-12 DIAGNOSIS — Z1231 Encounter for screening mammogram for malignant neoplasm of breast: Secondary | ICD-10-CM

## 2021-02-13 ENCOUNTER — Other Ambulatory Visit: Payer: Self-pay | Admitting: Family Medicine

## 2021-02-13 DIAGNOSIS — R928 Other abnormal and inconclusive findings on diagnostic imaging of breast: Secondary | ICD-10-CM

## 2021-02-28 ENCOUNTER — Other Ambulatory Visit: Payer: Self-pay

## 2021-02-28 ENCOUNTER — Ambulatory Visit
Admission: RE | Admit: 2021-02-28 | Discharge: 2021-02-28 | Disposition: A | Discharge status: Home / Self Care | Payer: Medicare PPO | Source: Ambulatory Visit | Attending: Family Medicine | Admitting: Family Medicine

## 2021-02-28 DIAGNOSIS — N6489 Other specified disorders of breast: Secondary | ICD-10-CM | POA: Diagnosis not present

## 2021-02-28 DIAGNOSIS — R928 Other abnormal and inconclusive findings on diagnostic imaging of breast: Secondary | ICD-10-CM | POA: Diagnosis not present

## 2021-02-28 DIAGNOSIS — R922 Inconclusive mammogram: Secondary | ICD-10-CM | POA: Diagnosis not present

## 2021-03-17 ENCOUNTER — Other Ambulatory Visit: Payer: Medicare PPO

## 2021-06-08 DIAGNOSIS — L304 Erythema intertrigo: Secondary | ICD-10-CM | POA: Diagnosis not present

## 2021-06-08 DIAGNOSIS — L570 Actinic keratosis: Secondary | ICD-10-CM | POA: Diagnosis not present

## 2021-06-08 DIAGNOSIS — D225 Melanocytic nevi of trunk: Secondary | ICD-10-CM | POA: Diagnosis not present

## 2021-06-08 DIAGNOSIS — L814 Other melanin hyperpigmentation: Secondary | ICD-10-CM | POA: Diagnosis not present

## 2021-06-08 DIAGNOSIS — L218 Other seborrheic dermatitis: Secondary | ICD-10-CM | POA: Diagnosis not present

## 2021-06-08 DIAGNOSIS — L821 Other seborrheic keratosis: Secondary | ICD-10-CM | POA: Diagnosis not present

## 2021-07-20 DIAGNOSIS — D225 Melanocytic nevi of trunk: Secondary | ICD-10-CM | POA: Diagnosis not present

## 2021-07-20 DIAGNOSIS — L239 Allergic contact dermatitis, unspecified cause: Secondary | ICD-10-CM | POA: Diagnosis not present

## 2021-07-20 DIAGNOSIS — L570 Actinic keratosis: Secondary | ICD-10-CM | POA: Diagnosis not present

## 2021-07-20 DIAGNOSIS — L821 Other seborrheic keratosis: Secondary | ICD-10-CM | POA: Diagnosis not present

## 2021-07-20 DIAGNOSIS — D492 Neoplasm of unspecified behavior of bone, soft tissue, and skin: Secondary | ICD-10-CM | POA: Diagnosis not present

## 2021-07-23 DIAGNOSIS — R233 Spontaneous ecchymoses: Secondary | ICD-10-CM | POA: Diagnosis not present

## 2021-07-23 DIAGNOSIS — S21002A Unspecified open wound of left breast, initial encounter: Secondary | ICD-10-CM | POA: Diagnosis not present

## 2021-10-29 DIAGNOSIS — D225 Melanocytic nevi of trunk: Secondary | ICD-10-CM | POA: Diagnosis not present

## 2021-10-29 DIAGNOSIS — L820 Inflamed seborrheic keratosis: Secondary | ICD-10-CM | POA: Diagnosis not present

## 2021-10-29 DIAGNOSIS — L905 Scar conditions and fibrosis of skin: Secondary | ICD-10-CM | POA: Diagnosis not present

## 2021-10-29 DIAGNOSIS — L570 Actinic keratosis: Secondary | ICD-10-CM | POA: Diagnosis not present

## 2021-10-29 DIAGNOSIS — L821 Other seborrheic keratosis: Secondary | ICD-10-CM | POA: Diagnosis not present

## 2021-10-29 DIAGNOSIS — L728 Other follicular cysts of the skin and subcutaneous tissue: Secondary | ICD-10-CM | POA: Diagnosis not present

## 2021-10-29 DIAGNOSIS — L7211 Pilar cyst: Secondary | ICD-10-CM | POA: Diagnosis not present

## 2021-10-29 DIAGNOSIS — L814 Other melanin hyperpigmentation: Secondary | ICD-10-CM | POA: Diagnosis not present

## 2021-10-29 DIAGNOSIS — L298 Other pruritus: Secondary | ICD-10-CM | POA: Diagnosis not present

## 2022-04-29 DIAGNOSIS — M67442 Ganglion, left hand: Secondary | ICD-10-CM | POA: Diagnosis not present

## 2022-04-29 DIAGNOSIS — L910 Hypertrophic scar: Secondary | ICD-10-CM | POA: Diagnosis not present

## 2022-04-29 DIAGNOSIS — L304 Erythema intertrigo: Secondary | ICD-10-CM | POA: Diagnosis not present

## 2022-06-09 DIAGNOSIS — L814 Other melanin hyperpigmentation: Secondary | ICD-10-CM | POA: Diagnosis not present

## 2022-06-09 DIAGNOSIS — D225 Melanocytic nevi of trunk: Secondary | ICD-10-CM | POA: Diagnosis not present

## 2022-06-09 DIAGNOSIS — L239 Allergic contact dermatitis, unspecified cause: Secondary | ICD-10-CM | POA: Diagnosis not present

## 2022-06-09 DIAGNOSIS — L910 Hypertrophic scar: Secondary | ICD-10-CM | POA: Diagnosis not present

## 2022-06-09 DIAGNOSIS — L570 Actinic keratosis: Secondary | ICD-10-CM | POA: Diagnosis not present

## 2022-06-09 DIAGNOSIS — L304 Erythema intertrigo: Secondary | ICD-10-CM | POA: Diagnosis not present

## 2022-06-09 DIAGNOSIS — L218 Other seborrheic dermatitis: Secondary | ICD-10-CM | POA: Diagnosis not present

## 2022-06-09 DIAGNOSIS — L821 Other seborrheic keratosis: Secondary | ICD-10-CM | POA: Diagnosis not present

## 2022-06-15 DIAGNOSIS — M67432 Ganglion, left wrist: Secondary | ICD-10-CM | POA: Diagnosis not present

## 2022-06-15 DIAGNOSIS — L608 Other nail disorders: Secondary | ICD-10-CM | POA: Diagnosis not present

## 2022-06-15 DIAGNOSIS — L603 Nail dystrophy: Secondary | ICD-10-CM | POA: Diagnosis not present

## 2022-07-21 DIAGNOSIS — L910 Hypertrophic scar: Secondary | ICD-10-CM | POA: Diagnosis not present

## 2022-07-21 DIAGNOSIS — S30860A Insect bite (nonvenomous) of lower back and pelvis, initial encounter: Secondary | ICD-10-CM | POA: Diagnosis not present

## 2022-07-21 DIAGNOSIS — L814 Other melanin hyperpigmentation: Secondary | ICD-10-CM | POA: Diagnosis not present

## 2022-07-21 DIAGNOSIS — L821 Other seborrheic keratosis: Secondary | ICD-10-CM | POA: Diagnosis not present

## 2022-07-21 DIAGNOSIS — D2239 Melanocytic nevi of other parts of face: Secondary | ICD-10-CM | POA: Diagnosis not present

## 2022-07-21 DIAGNOSIS — L304 Erythema intertrigo: Secondary | ICD-10-CM | POA: Diagnosis not present

## 2022-07-21 DIAGNOSIS — L298 Other pruritus: Secondary | ICD-10-CM | POA: Diagnosis not present

## 2022-08-04 ENCOUNTER — Other Ambulatory Visit: Payer: Self-pay | Admitting: Family Medicine

## 2022-08-04 DIAGNOSIS — Z1231 Encounter for screening mammogram for malignant neoplasm of breast: Secondary | ICD-10-CM

## 2022-08-12 DIAGNOSIS — S6992XA Unspecified injury of left wrist, hand and finger(s), initial encounter: Secondary | ICD-10-CM | POA: Diagnosis not present

## 2022-08-25 ENCOUNTER — Ambulatory Visit: Payer: Medicare PPO

## 2022-09-13 ENCOUNTER — Ambulatory Visit: Admission: RE | Admit: 2022-09-13 | Payer: Medicare PPO | Source: Ambulatory Visit

## 2022-09-13 DIAGNOSIS — Z1231 Encounter for screening mammogram for malignant neoplasm of breast: Secondary | ICD-10-CM

## 2023-01-31 DIAGNOSIS — L239 Allergic contact dermatitis, unspecified cause: Secondary | ICD-10-CM | POA: Diagnosis not present

## 2023-01-31 DIAGNOSIS — L821 Other seborrheic keratosis: Secondary | ICD-10-CM | POA: Diagnosis not present

## 2023-01-31 DIAGNOSIS — D492 Neoplasm of unspecified behavior of bone, soft tissue, and skin: Secondary | ICD-10-CM | POA: Diagnosis not present

## 2023-01-31 DIAGNOSIS — L218 Other seborrheic dermatitis: Secondary | ICD-10-CM | POA: Diagnosis not present

## 2023-01-31 DIAGNOSIS — D225 Melanocytic nevi of trunk: Secondary | ICD-10-CM | POA: Diagnosis not present

## 2023-01-31 DIAGNOSIS — C44311 Basal cell carcinoma of skin of nose: Secondary | ICD-10-CM | POA: Diagnosis not present

## 2023-01-31 DIAGNOSIS — Z09 Encounter for follow-up examination after completed treatment for conditions other than malignant neoplasm: Secondary | ICD-10-CM | POA: Diagnosis not present

## 2023-01-31 DIAGNOSIS — L814 Other melanin hyperpigmentation: Secondary | ICD-10-CM | POA: Diagnosis not present

## 2023-01-31 DIAGNOSIS — L570 Actinic keratosis: Secondary | ICD-10-CM | POA: Diagnosis not present

## 2023-01-31 DIAGNOSIS — Z872 Personal history of diseases of the skin and subcutaneous tissue: Secondary | ICD-10-CM | POA: Diagnosis not present

## 2023-03-16 DIAGNOSIS — C44311 Basal cell carcinoma of skin of nose: Secondary | ICD-10-CM | POA: Diagnosis not present

## 2023-04-15 DIAGNOSIS — L905 Scar conditions and fibrosis of skin: Secondary | ICD-10-CM | POA: Diagnosis not present

## 2023-04-15 DIAGNOSIS — R208 Other disturbances of skin sensation: Secondary | ICD-10-CM | POA: Diagnosis not present

## 2023-04-15 DIAGNOSIS — L538 Other specified erythematous conditions: Secondary | ICD-10-CM | POA: Diagnosis not present

## 2023-07-01 DIAGNOSIS — L905 Scar conditions and fibrosis of skin: Secondary | ICD-10-CM | POA: Diagnosis not present

## 2023-07-27 DIAGNOSIS — Z48817 Encounter for surgical aftercare following surgery on the skin and subcutaneous tissue: Secondary | ICD-10-CM | POA: Diagnosis not present

## 2023-08-04 DIAGNOSIS — L910 Hypertrophic scar: Secondary | ICD-10-CM | POA: Diagnosis not present

## 2023-08-04 DIAGNOSIS — L7211 Pilar cyst: Secondary | ICD-10-CM | POA: Diagnosis not present

## 2023-08-04 DIAGNOSIS — Z872 Personal history of diseases of the skin and subcutaneous tissue: Secondary | ICD-10-CM | POA: Diagnosis not present

## 2023-08-04 DIAGNOSIS — Z09 Encounter for follow-up examination after completed treatment for conditions other than malignant neoplasm: Secondary | ICD-10-CM | POA: Diagnosis not present

## 2023-08-04 DIAGNOSIS — L308 Other specified dermatitis: Secondary | ICD-10-CM | POA: Diagnosis not present

## 2023-08-04 DIAGNOSIS — Z7189 Other specified counseling: Secondary | ICD-10-CM | POA: Diagnosis not present

## 2023-08-04 DIAGNOSIS — L218 Other seborrheic dermatitis: Secondary | ICD-10-CM | POA: Diagnosis not present

## 2023-08-04 DIAGNOSIS — L814 Other melanin hyperpigmentation: Secondary | ICD-10-CM | POA: Diagnosis not present

## 2023-08-29 DIAGNOSIS — Z48817 Encounter for surgical aftercare following surgery on the skin and subcutaneous tissue: Secondary | ICD-10-CM | POA: Diagnosis not present

## 2023-09-27 DIAGNOSIS — C44311 Basal cell carcinoma of skin of nose: Secondary | ICD-10-CM | POA: Diagnosis not present

## 2023-09-27 DIAGNOSIS — M950 Acquired deformity of nose: Secondary | ICD-10-CM | POA: Diagnosis not present

## 2023-09-27 DIAGNOSIS — J34829 Nasal valve collapse, unspecified: Secondary | ICD-10-CM | POA: Diagnosis not present

## 2023-10-03 DIAGNOSIS — H33321 Round hole, right eye: Secondary | ICD-10-CM | POA: Diagnosis not present

## 2023-10-04 DIAGNOSIS — H348322 Tributary (branch) retinal vein occlusion, left eye, stable: Secondary | ICD-10-CM | POA: Diagnosis not present

## 2023-10-04 DIAGNOSIS — H3582 Retinal ischemia: Secondary | ICD-10-CM | POA: Diagnosis not present

## 2023-10-04 DIAGNOSIS — H43813 Vitreous degeneration, bilateral: Secondary | ICD-10-CM | POA: Diagnosis not present

## 2023-10-04 DIAGNOSIS — H31092 Other chorioretinal scars, left eye: Secondary | ICD-10-CM | POA: Diagnosis not present

## 2023-10-04 DIAGNOSIS — H2513 Age-related nuclear cataract, bilateral: Secondary | ICD-10-CM | POA: Diagnosis not present

## 2023-10-04 DIAGNOSIS — H33321 Round hole, right eye: Secondary | ICD-10-CM | POA: Diagnosis not present

## 2023-10-11 DIAGNOSIS — H2511 Age-related nuclear cataract, right eye: Secondary | ICD-10-CM | POA: Diagnosis not present

## 2023-10-11 DIAGNOSIS — H33321 Round hole, right eye: Secondary | ICD-10-CM | POA: Diagnosis not present

## 2023-10-11 DIAGNOSIS — H43811 Vitreous degeneration, right eye: Secondary | ICD-10-CM | POA: Diagnosis not present
